# Patient Record
Sex: Female | Born: 1990 | Race: Black or African American | Hispanic: No | Marital: Single | Smoking: Never smoker
Health system: Southern US, Community
[De-identification: ages and names within clinical notes are randomized; demographics above are authoritative.]

## PROBLEM LIST (undated history)

## (undated) DIAGNOSIS — IMO0002 Reserved for concepts with insufficient information to code with codable children: Secondary | ICD-10-CM

## (undated) DIAGNOSIS — A630 Anogenital (venereal) warts: Secondary | ICD-10-CM

## (undated) DIAGNOSIS — R519 Headache, unspecified: Secondary | ICD-10-CM

## (undated) HISTORY — DX: Reserved for concepts with insufficient information to code with codable children: IMO0002

## (undated) HISTORY — DX: Anogenital (venereal) warts: A63.0

---
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
## 2008-05-11 IMAGING — US Endovaginal
2 series · 14 of 16 positions shown · non-contrast
Comparison: none

[Series 1: endovaginal · 0.13mm/px · 12 of 15 slices shown (1 of 2)]
[im 1/15]
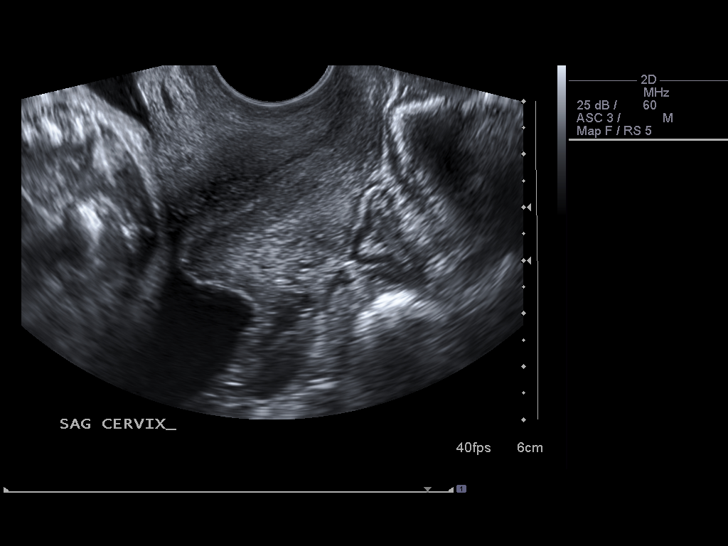
[im 2/15]
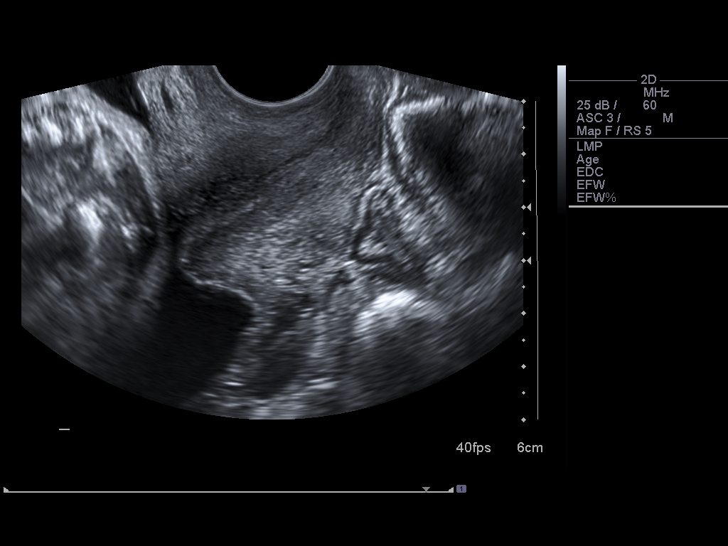
[im 3/15]
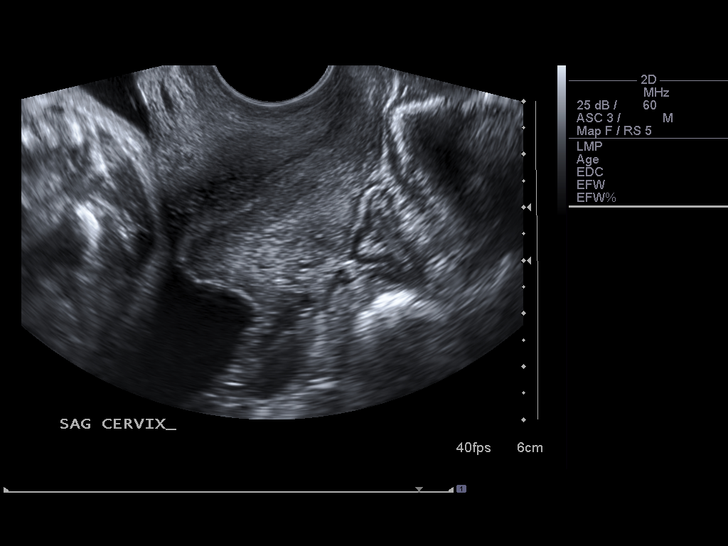
[im 5/15]
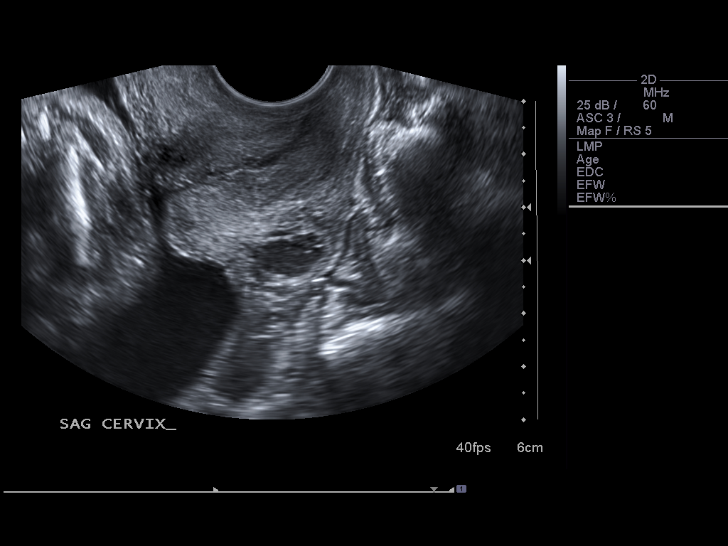
[im 6/15]
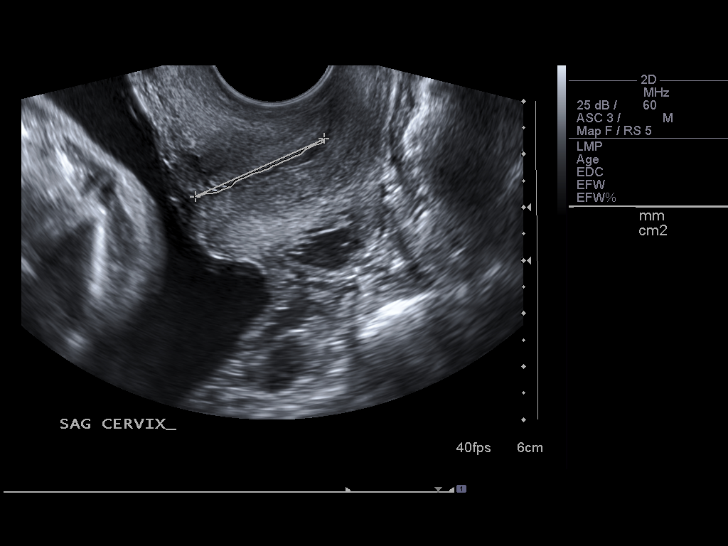
[im 7/15]
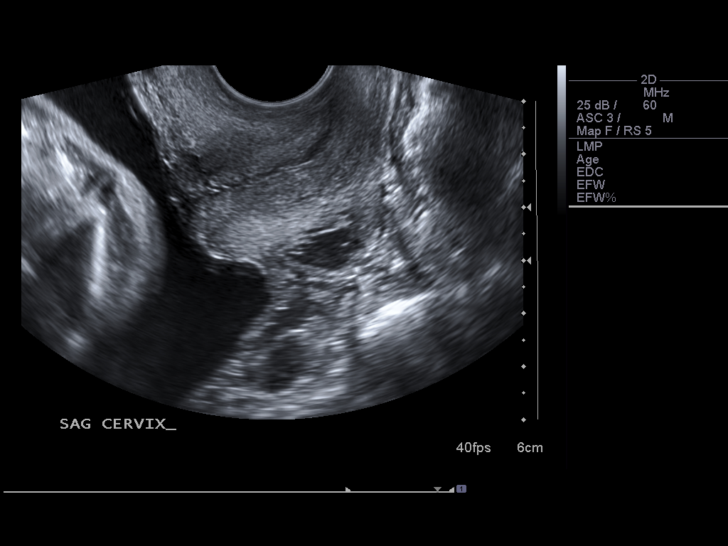
[im 8/15]
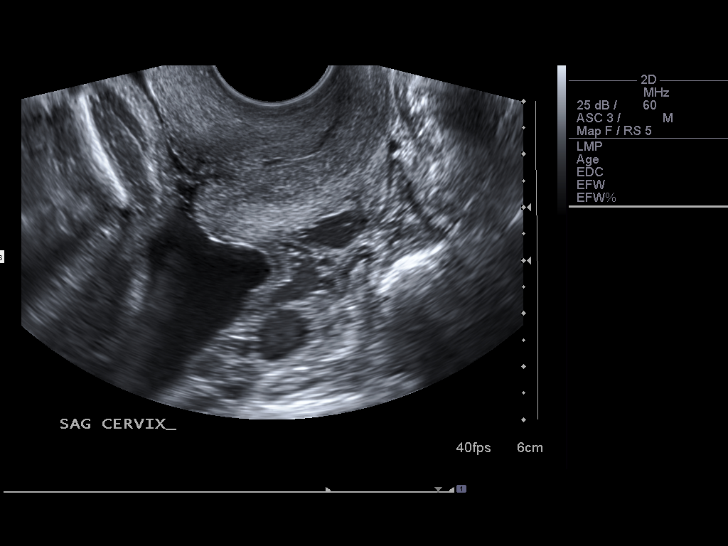
[im 9/15]
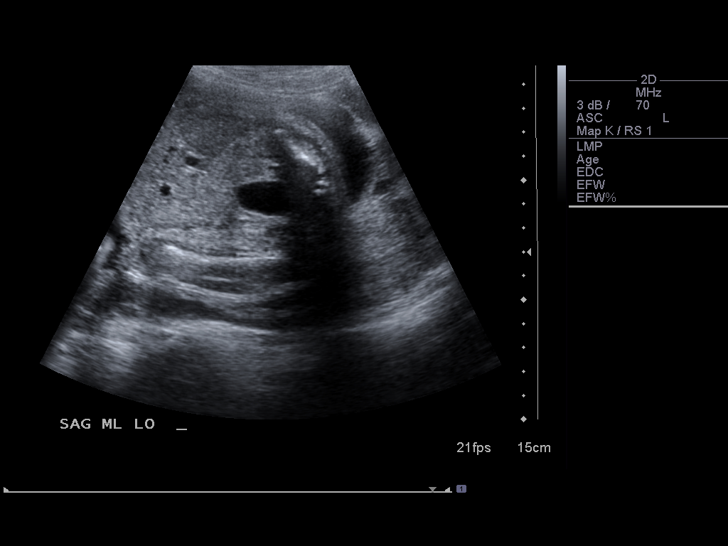
[im 10/15]
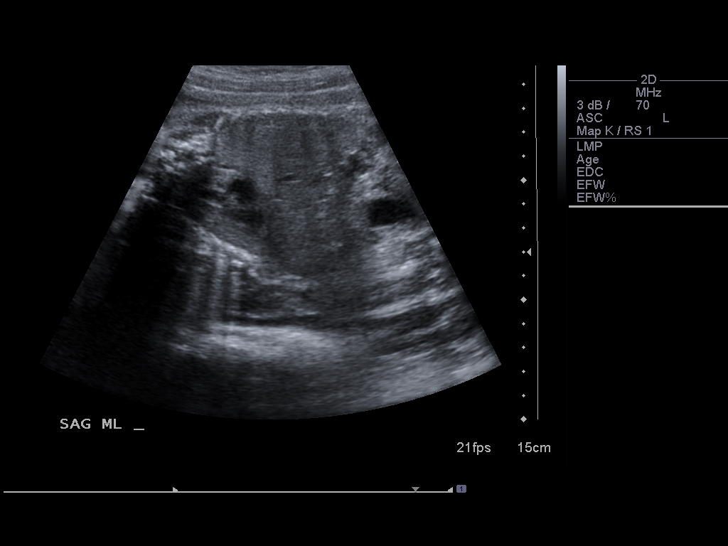
[im 11/15]
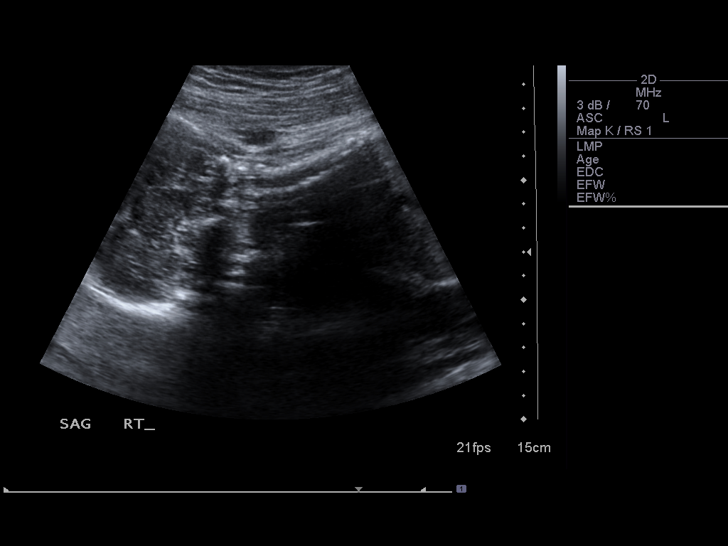
[im 13/15]
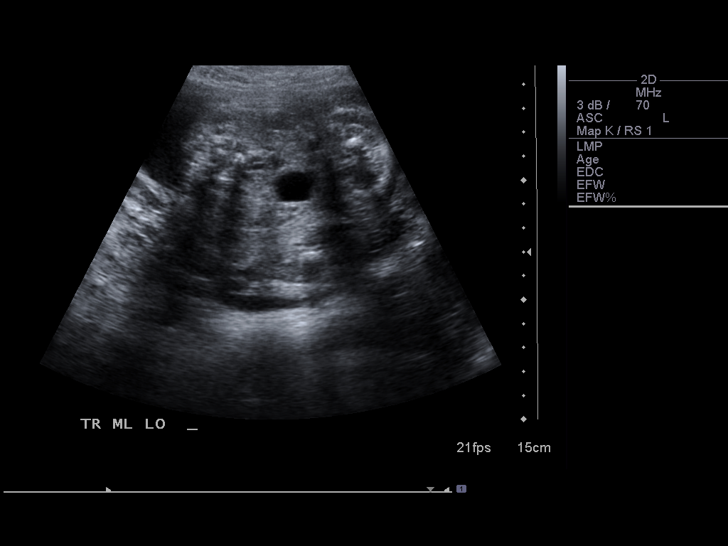
[im 15/15]
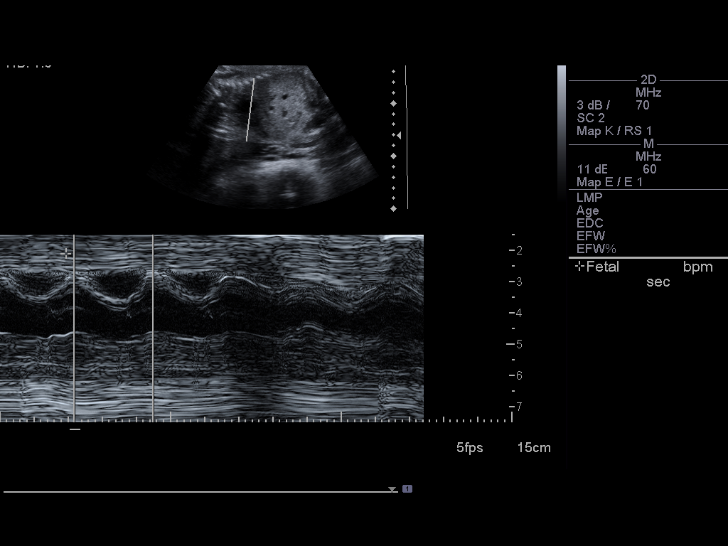

[Series 3: endovaginal · 0.13mm/px · 2 of 2 slices shown (2 of 2)]
[im 1/2]
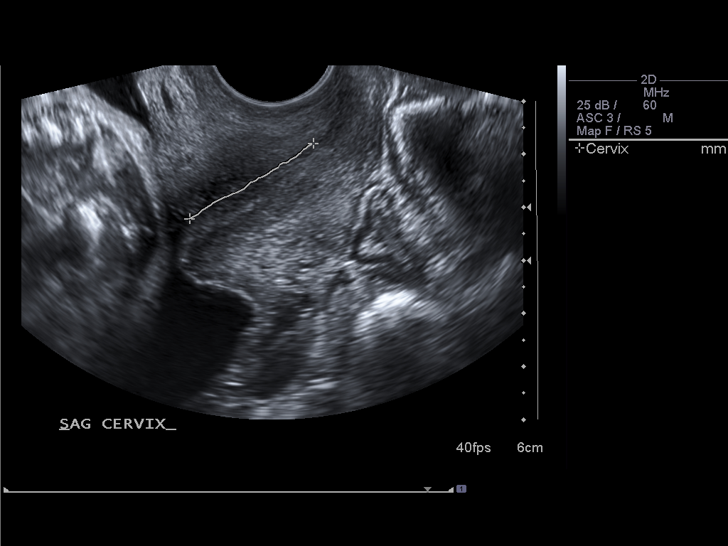
[im 2/2]
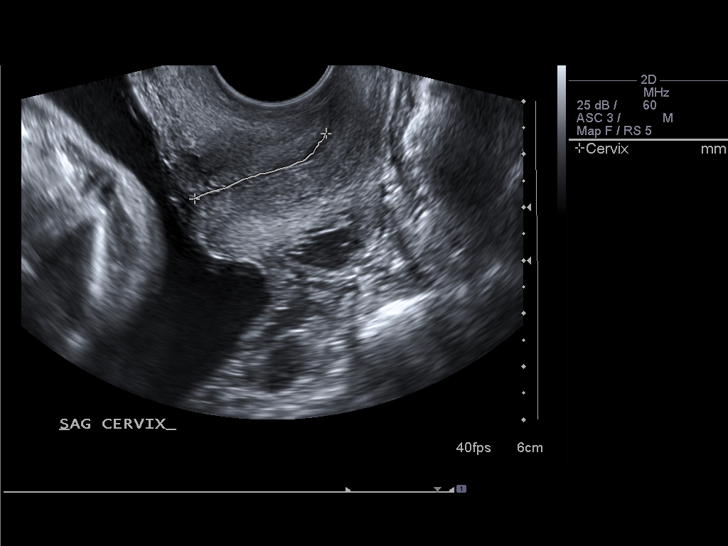

[14 of 16 positions shown; findings below may reference images not displayed]

Transvaginal was done just to evaluate for cervical length.  

The cervix measures 2.7 cm.  This is foreshortened.  

The results were given to Dr. Morjen by the ultrasound technologist.  

IMPRESSION-  

Foreshortened cervix.

## 2008-08-09 IMAGING — US OB
1 series · 16 of 16 positions shown · non-contrast
Comparison: none

[Series 1: ob · 16 of 17 slices shown]
[im 1/17]
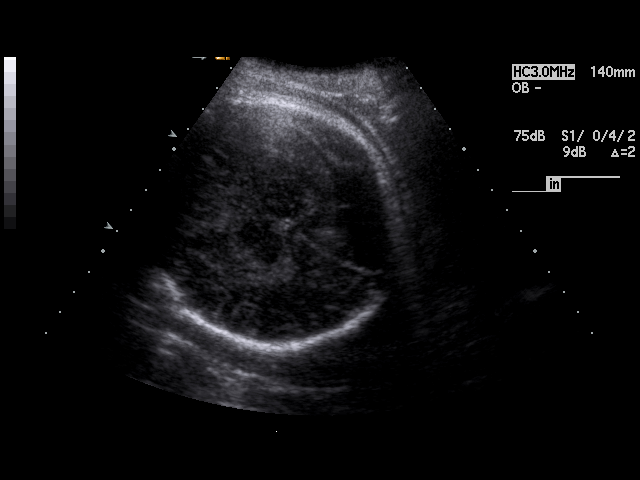
[im 2/17]
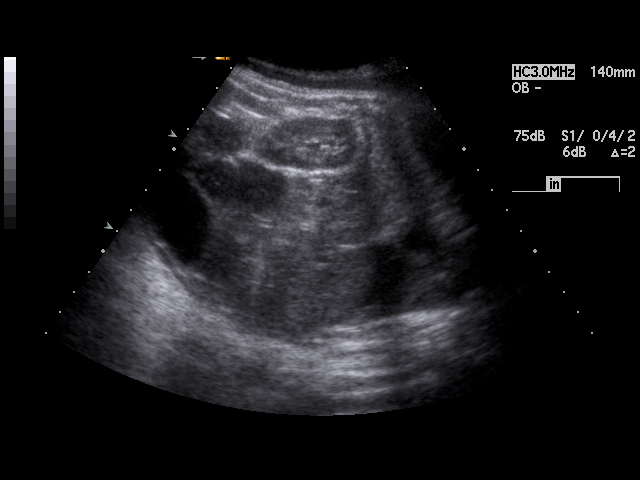
[im 3/17]
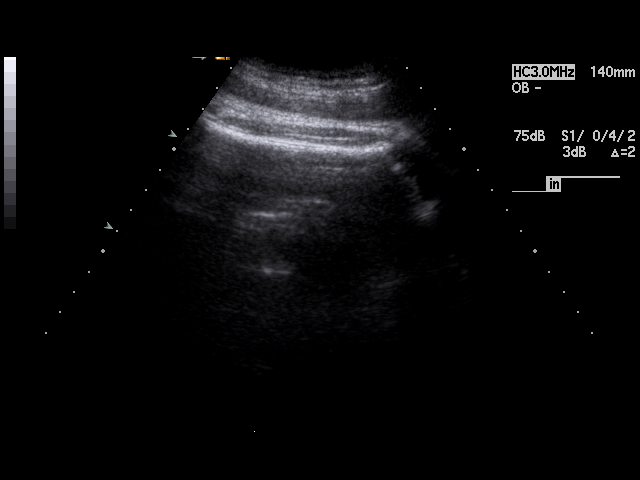
[im 4/17]
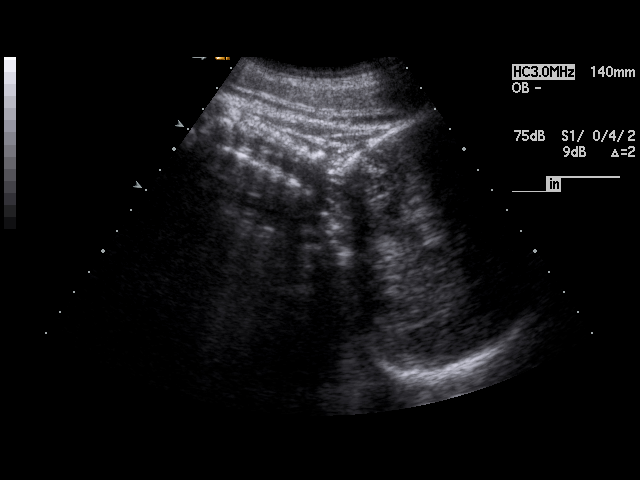
[im 5/17]
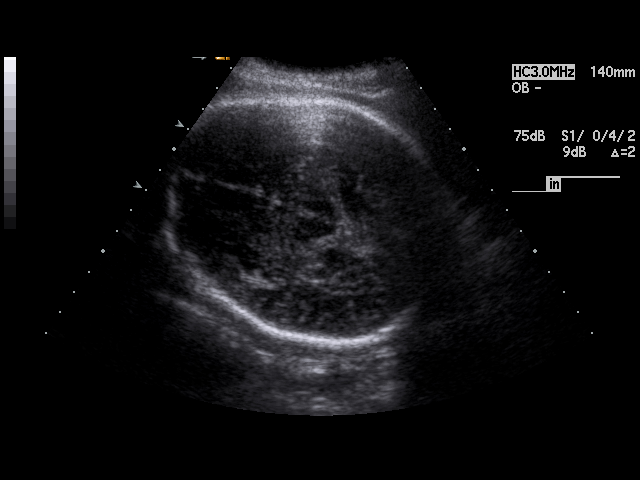
[im 6/17]
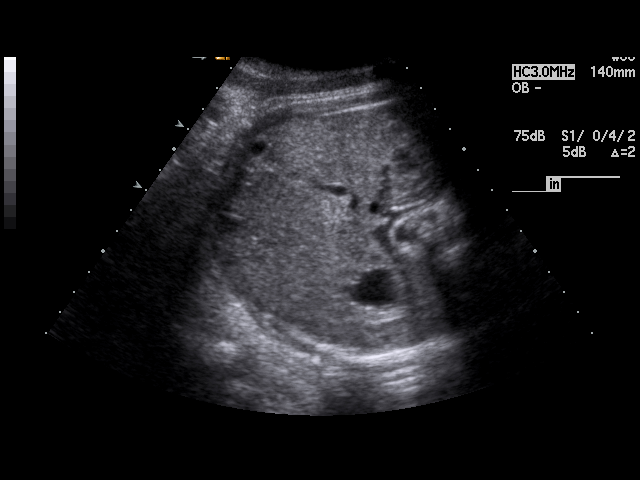
[im 7/17]
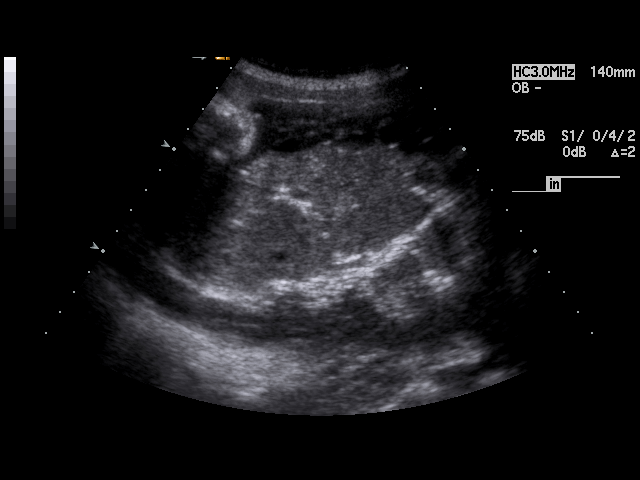
[im 8/17]
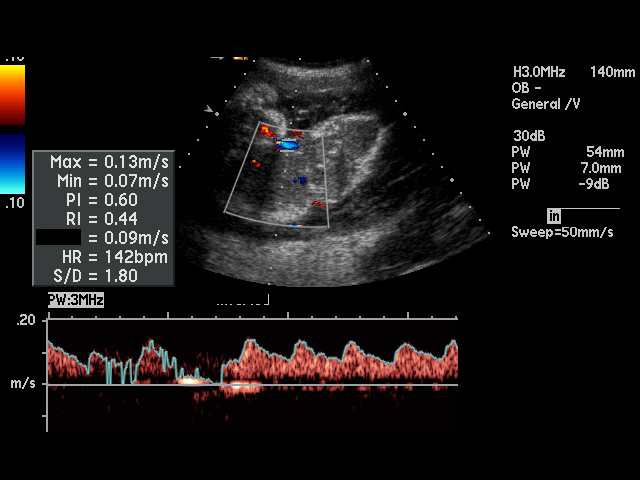
[im 9/17]
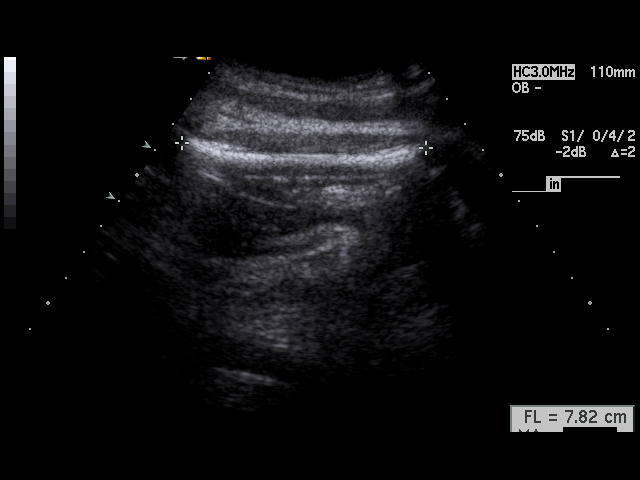
[im 10/17]
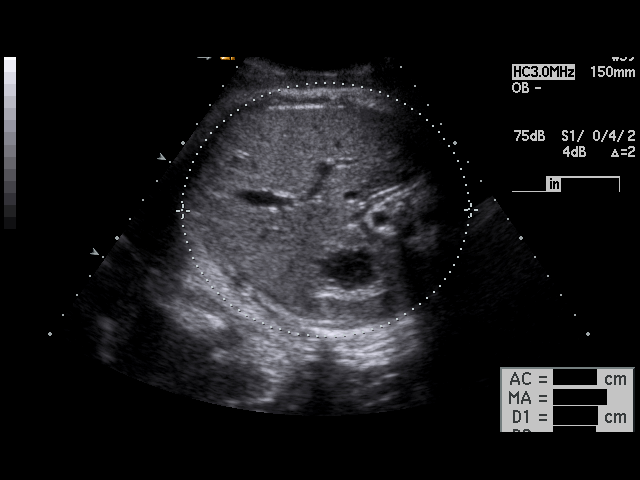
[im 11/17]
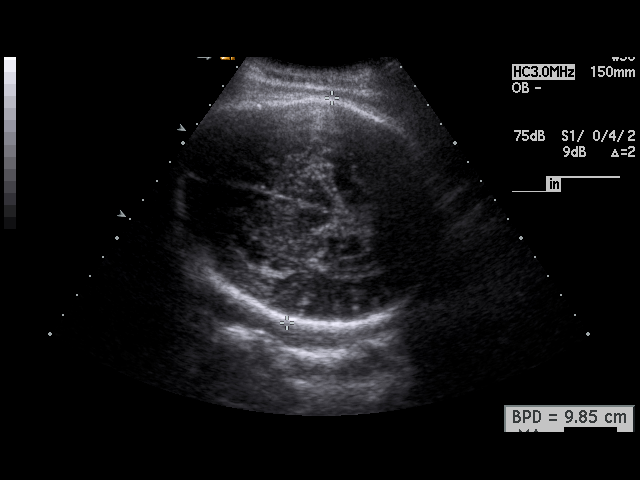
[im 12/17]
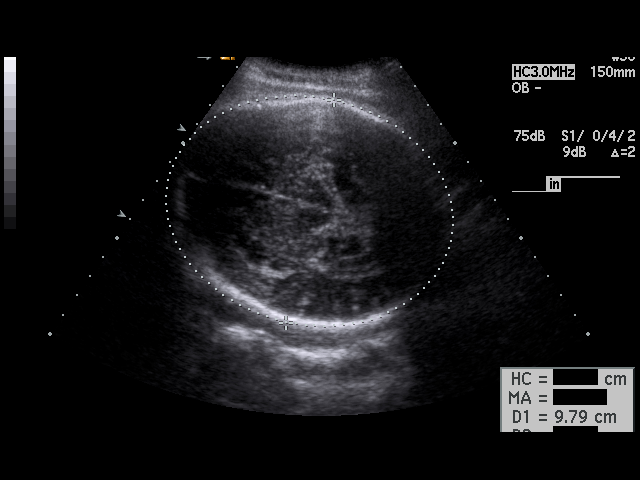
[im 13/17]
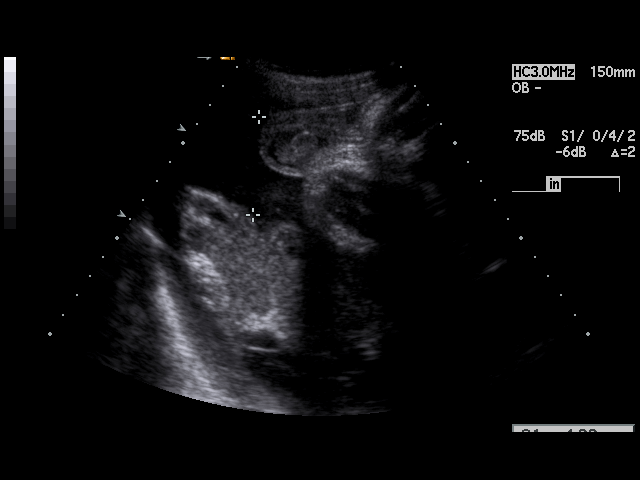
[im 14/17]
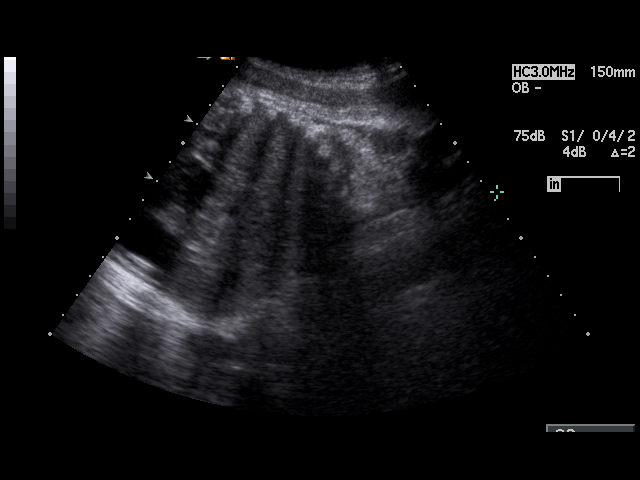
[im 15/17]
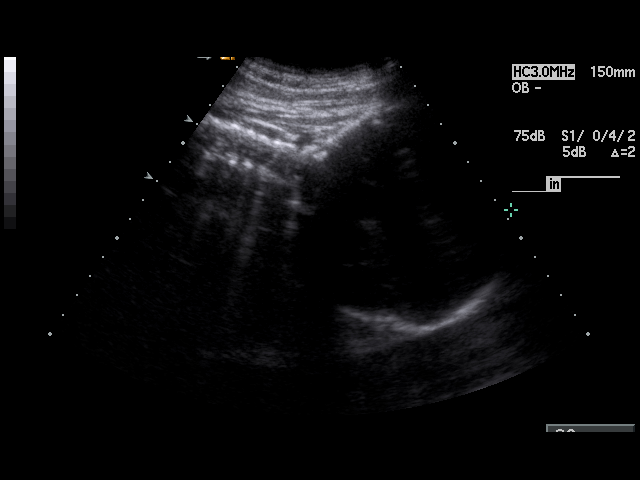
[im 17/17]
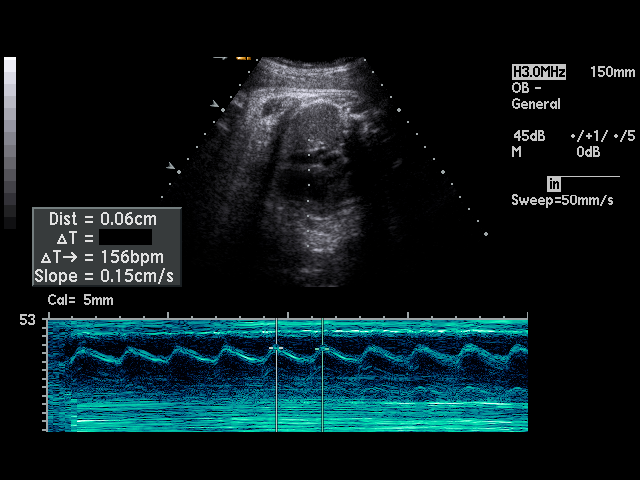

[16 of 16 positions shown; findings below may reference images not displayed]

REASON-  Post dates.

There is an intrauterine gestation in cephalic presentation.  There is

a posterior placenta.  The amniotic fluid index is 5.2 cm.  

BPD- 9.8, HC- 35.0, AC- 36.6 and FL- 7.8 cm correspond to a 40 week 3

day gestation.  The estimated date of delivery was 05/13/07.  There has

been appropriate interval growth compared to initial ultrasound dated

Biophysical profile score is [DATE].  Zero was given for amniotic fluid

volume by the ultrasound technologist. 

The estimated fetal weight is 4,106 grams.  Fetal heartbeat is 156 bpm.

The S/D ratio of the umbilical artery is 1.80.

IMPRESSION-

SINGLE LIVE INTRAUTERINE GESTATION MEAN ULTRASOUND OF 40 WEEKS 3 DAYS.

AFI 5.2 CM.

RESULTS WERE GIVEN [REDACTED] IN LABOR & DELIVERY BY THE ULTRASOUND

TECHNOLOGIST.

## 2010-03-09 DIAGNOSIS — IMO0002 Reserved for concepts with insufficient information to code with codable children: Secondary | ICD-10-CM

## 2010-03-09 DIAGNOSIS — R87619 Unspecified abnormal cytological findings in specimens from cervix uteri: Secondary | ICD-10-CM

## 2010-03-09 HISTORY — DX: Reserved for concepts with insufficient information to code with codable children: IMO0002

## 2010-03-09 HISTORY — DX: Unspecified abnormal cytological findings in specimens from cervix uteri: R87.619

## 2011-04-06 LAB — OB RESULTS CONSOLE HGB/HCT, BLOOD
HCT: 35 %
Hemoglobin: 11.9 g/dL

## 2011-04-06 LAB — CULTURE, OB URINE: Urine Culture, OB: NO GROWTH

## 2011-04-06 LAB — OB RESULTS CONSOLE RPR: RPR: NONREACTIVE

## 2011-05-04 LAB — GLUCOSE TOLERANCE, 1 HOUR: Glucose, 1 hour: 125

## 2011-07-20 ENCOUNTER — Ambulatory Visit (INDEPENDENT_AMBULATORY_CARE_PROVIDER_SITE_OTHER): Payer: Self-pay | Admitting: Family Medicine

## 2011-07-20 ENCOUNTER — Encounter: Payer: Self-pay | Admitting: Family Medicine

## 2011-07-20 VITALS — BP 123/83 | Temp 97.7°F | Ht 66.0 in | Wt 178.7 lb

## 2011-07-20 DIAGNOSIS — O34219 Maternal care for unspecified type scar from previous cesarean delivery: Secondary | ICD-10-CM

## 2011-07-20 DIAGNOSIS — O0990 Supervision of high risk pregnancy, unspecified, unspecified trimester: Secondary | ICD-10-CM | POA: Insufficient documentation

## 2011-07-20 LAB — POCT URINALYSIS DIP (DEVICE)
Bilirubin Urine: NEGATIVE
Hgb urine dipstick: NEGATIVE
Ketones, ur: NEGATIVE mg/dL
Specific Gravity, Urine: 1.025 (ref 1.005–1.030)
pH: 6 (ref 5.0–8.0)

## 2011-07-20 LAB — HIV ANTIBODY (ROUTINE TESTING W REFLEX): HIV: NONREACTIVE

## 2011-07-20 LAB — CBC
MCHC: 33.8 g/dL (ref 30.0–36.0)
Platelets: 161 10*3/uL (ref 150–400)
RDW: 13.9 % (ref 11.5–15.5)
WBC: 8.1 10*3/uL (ref 4.0–10.5)

## 2011-07-20 LAB — OB RESULTS CONSOLE GBS: GBS: NEGATIVE

## 2011-07-20 LAB — RPR

## 2011-07-20 NOTE — Patient Instructions (Addendum)
Vaginal Birth After Cesarean Delivery Vaginal birth after Cesarean delivery (VBAC) is giving birth vaginally after previously delivering a baby by a cesarean. In the past, if a woman had a Cesarean delivery, all births afterwards would be done by Cesarean delivery. This is no longer true. It can be safe for the mother to try a vaginal delivery after having a Cesarean. The final decision to have a VBAC or repeat Cesarean delivery should be between the patient and her caregiver. The risks and benefits can be discussed relative to the reason for, and the type of the previous Cesarean delivery. WOMEN WHO PLAN TO HAVE A VBAC SHOULD CHECK WITH THEIR DOCTOR TO BE SURE THAT:  The previous Cesarean was done with a low transverse uterine incision (not a vertical classical incision).   The birth canal is big enough for the baby.   There were no other operations on the uterus.   They will have an electronic fetal monitor (EFM) on at all times during labor.   An operating room would be available and ready in case an emergency Cesarean is needed.   A doctor and surgical nursing staff would be available at all times during labor to be ready to do an emergency Cesarean if necessary.   An anesthesiologist would be present in case an emergency Cesarean is needed.   The nursery is prepared and has adequate personnel and necessary equipment available to care for the baby in case of an emergency Cesarean.  BENEFITS OF VBAC:  Shorter stay in the hospital.   Lower delivery, nursery and hospital costs.   Less blood loss and need for blood transfusions.   Less fever and discomfort from major surgery.   Lower risk of blood clots.   Lower risk of infection.   Shorter recovery after going home.   Lower risk of other surgical complications, such as opening of the incision or hernia in the incision.   Decreased risk of injury to other organs.   Decreased risk for having to remove the uterus (hysterectomy).     Decreased risk for the placenta to completely or partially cover the opening of the uterus (placenta previa) with a future pregnancy.   Ability to have a larger family if desired.  RISKS OF A VBAC:  Rupture of the uterus.   Having to remove the uterus (hysterectomy) if it ruptures.   All the complications of major surgery and/or injury to other organs.   Excessive bleeding, blood clots and infection.   Lower Apgar scores (method to evaluate the newborn based on appearance, pulse, grimace, activity, and respiration) and more risks to the baby.   There is a higher risk of uterine rupture if you induce or augment labor.   There is a higher risk of uterine rupture if you use medications to ripen the cervix.  VBAC SHOULD NOT BE DONE IF:  The previous Cesarean was done with a vertical (classical) or T-shaped incision, or you do not know what kind of an incision was made.   You had a ruptured uterus.   You had surgery on your uterus.   You have medical or obstetrical problems.   There are problems with the baby.   There were two previous Cesarean deliveries and no vaginal deliveries.  OTHER FACTS TO KNOW ABOUT VBAC:  It is safe to have an epidural anesthetic with VBAC.   It is safe to turn the baby from a breech position (attempt an external cephalic version).   It is  safe to try a VBAC with twins.   Pregnancies later than 40 weeks have not been successful with VBAC.   There is an increased failure rate of a VBAC in obese pregnant women.   There is an increased failure rate with VABC if the baby weighs 8.8 pounds (4000 grams) or more.   There is an increased failure rate if the time between the Cesarean and VBAC is less than 19 months.   There is an increased failure rate if pre-eclampsia is present (high blood pressure, protein in the urine and swelling of face and extremities).   VBAC is very successful if there was a previous vaginal birth.   VBAC is very successful  when the labor starts spontaneously before the due date.   Delivery of VBAC is similar to having a normal spontaneous vaginal delivery.  It is important to discuss VBAC with your caregiver early in the pregnancy so you can understand the risks, benefits and options. It will give you time to decide what is best in your particular case relevant to the reason for your previous Cesarean delivery. It should be understood that medical changes in the mother or pregnancy may occur during the pregnancy, which make it necessary to change you or your caregiver's initial decision. The counseling, concerns and decisions should be documented in the medical record and signed by all parties. Document Released: 08/16/2006 Document Revised: 02/12/2011 Document Reviewed: 04/06/2008 [facility] Patient Information 2012 [place], [place]. Pregnancy - Third Trimester The third trimester of pregnancy (the last 3 months) is a period of the most rapid growth for you and your baby. The baby approaches a length of 20 inches and a weight of 6 to 10 pounds. The baby is adding on fat and getting ready for life outside your body. While inside, babies have periods of sleeping and waking, suck their thumbs, and hiccups. You can often feel small contractions of the uterus. This is false labor. It is also called Braxton-Hicks contractions. This is like a practice for labor. The usual problems in this stage of pregnancy include more difficulty breathing, swelling of the hands and feet from water retention, and having to urinate more often because of the uterus and baby pressing on your bladder.  PRENATAL EXAMS  Blood work may continue to be done during prenatal exams. These tests are done to check on your health and the probable health of your baby. Blood work is used to follow your blood levels (hemoglobin). Anemia (low hemoglobin) is common during pregnancy. Iron and vitamins are given to help prevent this. You may also continue to be checked  for diabetes. Some of the past blood tests may be done again.   The size of the uterus is measured during each visit. This makes sure your baby is growing properly according to your pregnancy dates.   Your blood pressure is checked every prenatal visit. This is to make sure you are not getting toxemia.   Your urine is checked every prenatal visit for infection, diabetes and protein.   Your weight is checked at each visit. This is done to make sure gains are happening at the suggested rate and that you and your baby are growing normally.   Sometimes, an ultrasound is performed to confirm the position and the proper growth and development of the baby. This is a test done that bounces harmless sound waves off the baby so your caregiver can more accurately determine due dates.   Discuss the type of pain medication  and anesthesia you will have during your labor and delivery.   Discuss the possibility and anesthesia if a Cesarean Section might be necessary.   Inform your caregiver if there is any mental or physical violence at home.  Sometimes, a specialized non-stress test, contraction stress test and biophysical profile are done to make sure the baby is not having a problem. Checking the amniotic fluid surrounding the baby is called an amniocentesis. The amniotic fluid is removed by sticking a needle into the belly (abdomen). This is sometimes done near the end of pregnancy if an early delivery is required. In this case, it is done to help make sure the baby's lungs are mature enough for the baby to live outside of the womb. If the lungs are not mature and it is unsafe to deliver the baby, an injection of cortisone medication is given to the mother 1 to 2 days before the delivery. This helps the baby's lungs mature and makes it safer to deliver the baby. CHANGES OCCURING IN THE THIRD TRIMESTER OF PREGNANCY Your body goes through many changes during pregnancy. They vary from person to person. Talk to  your caregiver about changes you notice and are concerned about.  During the last trimester, you have probably had an increase in your appetite. It is normal to have cravings for certain foods. This varies from person to person and pregnancy to pregnancy.   You may begin to get stretch marks on your hips, abdomen, and breasts. These are normal changes in the body during pregnancy. There are no exercises or medications to take which prevent this change.   Constipation may be treated with a stool softener or adding bulk to your diet. Drinking lots of fluids, fiber in vegetables, fruits, and whole grains are helpful.   Exercising is also helpful. If you have been very active up until your pregnancy, most of these activities can be continued during your pregnancy. If you have been less active, it is helpful to start an exercise program such as walking. Consult your caregiver before starting exercise programs.   Avoid all smoking, alcohol, un-prescribed drugs, herbs and "street drugs" during your pregnancy. These chemicals affect the formation and growth of the baby. Avoid chemicals throughout the pregnancy to ensure the delivery of a healthy infant.   Backache, varicose veins and hemorrhoids may develop or get worse.   You will tire more easily in the third trimester, which is normal.   The baby's movements may be stronger and more often.   You may become short of breath easily.   Your belly button may stick out.   A yellow discharge may leak from your breasts called colostrum.   You may have a bloody mucus discharge. This usually occurs a few days to a week before labor begins.  HOME CARE INSTRUCTIONS   Keep your caregiver's appointments. Follow your caregiver's instructions regarding medication use, exercise, and diet.   During pregnancy, you are providing food for you and your baby. Continue to eat regular, well-balanced meals. Choose foods such as meat, fish, milk and other low fat dairy  products, vegetables, fruits, and whole-grain breads and cereals. Your caregiver will tell you of the ideal weight gain.   A physical sexual relationship may be continued throughout pregnancy if there are no other problems such as early (premature) leaking of amniotic fluid from the membranes, vaginal bleeding, or belly (abdominal) pain.   Exercise regularly if there are no restrictions. Check with your caregiver if you are  unsure of the safety of your exercises. Greater weight gain will occur in the last 2 trimesters of pregnancy. Exercising helps:   Control your weight.   Get you in shape for labor and delivery.   You lose weight after you deliver.   Rest a lot with legs elevated, or as needed for leg cramps or low back pain.   Wear a good support or jogging bra for breast tenderness during pregnancy. This may help if worn during sleep. Pads or tissues may be used in the bra if you are leaking colostrum.   Do not use hot tubs, steam rooms, or saunas.   Wear your seat belt when driving. This protects you and your baby if you are in an accident.   Avoid raw meat, cat litter boxes and soil used by cats. These carry germs that can cause birth defects in the baby.   It is easier to loose urine during pregnancy. Tightening up and strengthening the pelvic muscles will help with this problem. You can practice stopping your urination while you are going to the bathroom. These are the same muscles you need to strengthen. It is also the muscles you would use if you were trying to stop from passing gas. You can practice tightening these muscles up 10 times a set and repeating this about 3 times per day. Once you know what muscles to tighten up, do not perform these exercises during urination. It is more likely to cause an infection by backing up the urine.   Ask for help if you have financial, counseling or nutritional needs during pregnancy. Your caregiver will be able to offer counseling for these  needs as well as refer you for other special needs.   Make a list of emergency phone numbers and have them available.   Plan on getting help from family or friends when you go home from the hospital.   Make a trial run to the hospital.   Take prenatal classes with the father to understand, practice and ask questions about the labor and delivery.   Prepare the baby's room/nursery.   Do not travel out of the city unless it is absolutely necessary and with the advice of your caregiver.   Wear only low or no heal shoes to have better balance and prevent falling.  MEDICATIONS AND DRUG USE IN PREGNANCY  Take prenatal vitamins as directed. The vitamin should contain 1 milligram of folic acid. Keep all vitamins out of reach of children. Only a couple vitamins or tablets containing iron may be fatal to a baby or young child when ingested.   Avoid use of all medications, including herbs, over-the-counter medications, not prescribed or suggested by your caregiver. Only take over-the-counter or prescription medicines for pain, discomfort, or fever as directed by your caregiver. Do not use aspirin, ibuprofen (Motrin, Advil, Nuprin) or naproxen (Aleve) unless OK'd by your caregiver.   Let your caregiver also know about herbs you may be using.   Alcohol is related to a number of birth defects. This includes fetal alcohol syndrome. All alcohol, in any form, should be avoided completely. Smoking will cause low birth rate and premature babies.   Street/illegal drugs are very harmful to the baby. They are absolutely forbidden. A baby born to an addicted mother will be addicted at birth. The baby will go through the same withdrawal an adult does.  SEEK MEDICAL CARE IF: You have any concerns or worries during your pregnancy. It is better to call with  your questions if you feel they cannot wait, rather than worry about them. DECISIONS ABOUT CIRCUMCISION You may or may not know the sex of your baby. If you  know your baby is a boy, it may be time to think about circumcision. Circumcision is the removal of the foreskin of the penis. This is the skin that covers the sensitive end of the penis. There is no proven medical need for this. Often this decision is made on what is popular at the time or based upon religious beliefs and social issues. You can discuss these issues with your caregiver or pediatrician. SEEK IMMEDIATE MEDICAL CARE IF:   An unexplained oral temperature above 102 F (38.9 C) develops, or as your caregiver suggests.   You have leaking of fluid from the vagina (birth canal). If leaking membranes are suspected, take your temperature and tell your caregiver of this when you call.   There is vaginal spotting, bleeding or passing clots. Tell your caregiver of the amount and how many pads are used.   You develop a bad smelling vaginal discharge with a change in the color from clear to white.   You develop vomiting that lasts more than 24 hours.   You develop chills or fever.   You develop shortness of breath.   You develop burning on urination.   You loose more than 2 pounds of weight or gain more than 2 pounds of weight or as suggested by your caregiver.   You notice sudden swelling of your face, hands, and feet or legs.   You develop belly (abdominal) pain. Round ligament discomfort is a common non-cancerous (benign) cause of abdominal pain in pregnancy. Your caregiver still must evaluate you.   You develop a severe headache that does not go away.   You develop visual problems, blurred or double vision.   If you have not felt your baby move for more than 1 hour. If you think the baby is not moving as much as usual, eat something with sugar in it and lie down on your left side for an hour. The baby should move at least 4 to 5 times per hour. Call right away if your baby moves less than that.   You fall, are in a car accident or any kind of trauma.   There is mental or  physical violence at home.  Document Released: 02/17/2001 Document Revised: 02/12/2011 Document Reviewed: 08/22/2008 [facility] Patient Information 2012 [place], [place]. Contraception Choices Contraception (birth control) is the use of any methods or devices to prevent pregnancy. Below are some methods to help avoid pregnancy. HORMONAL METHODS   Contraceptive implant. This is a thin, plastic tube containing progesterone hormone. It does not contain estrogen hormone. Your caregiver inserts the tube in the inner part of the upper arm. The tube can remain in place for up to 3 years. After 3 years, the implant must be removed. The implant prevents the ovaries from releasing an egg (ovulation), thickens the cervical mucus which prevents sperm from entering the uterus, and thins the lining of the inside of the uterus.   Progesterone-only injections. These injections are given every 3 months by your caregiver to prevent pregnancy. This synthetic progesterone hormone stops the ovaries from releasing eggs. It also thickens cervical mucus and changes the uterine lining. This makes it harder for sperm to survive in the uterus.   Birth control pills. These pills contain estrogen and progesterone hormone. They work by stopping the egg from forming in the ovary (  ovulation). Birth control pills are prescribed by a caregiver.Birth control pills can also be used to treat heavy periods.   Minipill. This type of birth control pill contains only the progesterone hormone. They are taken every day of each month and must be prescribed by your caregiver.   Birth control patch. The patch contains hormones similar to those in birth control pills. It must be changed once a week and is prescribed by a caregiver.   Vaginal ring. The ring contains hormones similar to those in birth control pills. It is left in the vagina for 3 weeks, removed for 1 week, and then a new one is put back in place. The patient must be comfortable  inserting and removing the ring from the vagina.A caregiver's prescription is necessary.   Emergency contraception. Emergency contraceptives prevent pregnancy after unprotected sexual intercourse. This pill can be taken right after sex or up to 5 days after unprotected sex. It is most effective the sooner you take the pills after having sexual intercourse. Emergency contraceptive pills are available without a prescription. Check with your pharmacist. Do not use emergency contraception as your only form of birth control.  BARRIER METHODS   Female condom. This is a thin sheath (latex or rubber) that is worn over the penis during sexual intercourse. It can be used with spermicide to increase effectiveness.   Female condom. This is a soft, loose-fitting sheath that is put into the vagina before sexual intercourse.   Diaphragm. This is a soft, latex, dome-shaped barrier that must be fitted by a caregiver. It is inserted into the vagina, along with a spermicidal jelly. It is inserted before intercourse. The diaphragm should be left in the vagina for 6 to 8 hours after intercourse.   Cervical cap. This is a round, soft, latex or plastic cup that fits over the cervix and must be fitted by a caregiver. The cap can be left in place for up to 48 hours after intercourse.   Sponge. This is a soft, circular piece of polyurethane foam. The sponge has spermicide in it. It is inserted into the vagina after wetting it and before sexual intercourse.   Spermicides. These are chemicals that kill or block sperm from entering the cervix and uterus. They come in the form of creams, jellies, suppositories, foam, or tablets. They do not require a prescription. They are inserted into the vagina with an applicator before having sexual intercourse. The process must be repeated every time you have sexual intercourse.  INTRAUTERINE CONTRACEPTION  Intrauterine device (IUD). This is a T-shaped device that is put in a woman's uterus  during a menstrual period to prevent pregnancy. There are 2 types:   Copper IUD. This type of IUD is wrapped in copper wire and is placed inside the uterus. Copper makes the uterus and fallopian tubes produce a fluid that kills sperm. It can stay in place for 10 years.   Hormone IUD. This type of IUD contains the hormone progestin (synthetic progesterone). The hormone thickens the cervical mucus and prevents sperm from entering the uterus, and it also thins the uterine lining to prevent implantation of a fertilized egg. The hormone can weaken or kill the sperm that get into the uterus. It can stay in place for 5 years.  PERMANENT METHODS OF CONTRACEPTION  Female tubal ligation. This is when the woman's fallopian tubes are surgically sealed, tied, or blocked to prevent the egg from traveling to the uterus.   Female sterilization. This is when the  female has the tubes that carry sperm tied off (vasectomy).This blocks sperm from entering the vagina during sexual intercourse. After the procedure, the man can still ejaculate fluid (semen).  NATURAL PLANNING METHODS  Natural family planning. This is not having sexual intercourse or using a barrier method (condom, diaphragm, cervical cap) on days the woman could become pregnant.   Calendar method. This is keeping track of the length of each menstrual cycle and identifying when you are fertile.   Ovulation method. This is avoiding sexual intercourse during ovulation.   Symptothermal method. This is avoiding sexual intercourse during ovulation, using a thermometer and ovulation symptoms.   Post-ovulation method. This is timing sexual intercourse after you have ovulated.  Regardless of which type or method of contraception you choose, it is important that you use condoms to protect against the transmission of sexually transmitted diseases (STDs). Talk with your caregiver about which form of contraception is most appropriate for you. Document Released:  02/23/2005 Document Revised: 02/12/2011 Document Reviewed: 07/02/2010 [facility] Patient Information 2012 [place], [place]. Breastfeeding BENEFITS OF BREASTFEEDING For the baby  The first milk (colostrum) helps the baby's digestive system function better.   There are antibodies from the mother in the milk that help the baby fight off infections.   The baby has a lower incidence of asthma, allergies, and SIDS (sudden infant death syndrome).   The nutrients in breast milk are better than formulas for the baby and helps the baby's brain grow better.   Babies who breastfeed have less gas, colic, and constipation.  For the mother  Breastfeeding helps develop a very special bond between mother and baby.   It is more convenient, always available at the correct temperature and cheaper than formula feeding.   It burns calories in the mother and helps with losing weight that was gained during pregnancy.   It makes the uterus contract back down to normal size faster and slows bleeding following delivery.   Breastfeeding mothers have a lower risk of developing breast cancer.  NURSE FREQUENTLY  A healthy, full-term baby may breastfeed as often as every hour or space his or her feedings to every 3 hours.   How often to nurse will vary from baby to baby. Watch your baby for signs of hunger, not the clock.   Nurse as often as the baby requests, or when you feel the need to reduce the fullness of your breasts.   Awaken the baby if it has been 3 to 4 hours since the last feeding.   Frequent feeding will help the mother make more milk and will prevent problems like sore nipples and engorgement of the breasts.  BABY'S POSITION AT THE BREAST  Whether lying down or sitting, be sure that the baby's tummy is facing your tummy.   Support the breast with 4 fingers underneath the breast and the thumb above. Make sure your fingers are well away from the nipple and baby's mouth.   Stroke the baby's lips  and cheek closest to the breast gently with your finger or nipple.   When the baby's mouth is open wide enough, place all of your nipple and as much of the dark area around the nipple as possible into your baby's mouth.   Pull the baby in close so the tip of the nose and the baby's cheeks touch the breast during the feeding.  FEEDINGS  The length of each feeding varies from baby to baby and from feeding to feeding.   The baby  must suck about 2 to 3 minutes for your milk to get to him or her. This is called a "let down." For this reason, allow the baby to feed on each breast as long as he or she wants. Your baby will end the feeding when he or she has received the right balance of nutrients.   To break the suction, put your finger into the corner of the baby's mouth and slide it between his or her gums before removing your breast from his or her mouth. This will help prevent sore nipples.  REDUCING BREAST ENGORGEMENT  In the first week after your baby is born, you may experience signs of breast engorgement. When breasts are engorged, they feel heavy, warm, full, and may be tender to the touch. You can reduce engorgement if you:   Nurse frequently, every 2 to 3 hours. Mothers who breastfeed early and often have fewer problems with engorgement.   Place light ice packs on your breasts between feedings. This reduces swelling. Wrap the ice packs in a lightweight towel to protect your skin.   Apply moist hot packs to your breast for 5 to 10 minutes before each feeding. This increases circulation and helps the milk flow.   Gently massage your breast before and during the feeding.   Make sure that the baby empties at least one breast at every feeding before switching sides.   Use a breast pump to empty the breasts if your baby is sleepy or not nursing well. You may also want to pump if you are returning to work or or you feel you are getting engorged.   Avoid bottle feeds, pacifiers or  supplemental feedings of water or juice in place of breastfeeding.   Be sure the baby is latched on and positioned properly while breastfeeding.   Prevent fatigue, stress, and anemia.   Wear a supportive bra, avoiding underwire styles.   Eat a balanced diet with enough fluids.  If you follow these suggestions, your engorgement should improve in 24 to 48 hours. If you are still experiencing difficulty, call your lactation consultant or caregiver. IS MY BABY GETTING ENOUGH MILK? Sometimes, mothers worry about whether their babies are getting enough milk. You can be assured that your baby is getting enough milk if:  The baby is actively sucking and you hear swallowing.   The baby nurses at least 8 to 12 times in a 24 hour time period. Nurse your baby until he or she unlatches or falls asleep at the first breast (at least 10 to 20 minutes), then offer the second side.   The baby is wetting 5 to 6 disposable diapers (6 to 8 cloth diapers) in a 24 hour period by 27 to 48 days of age.   The baby is having at least 2 to 3 stools every 24 hours for the first few months. Breast milk is all the food your baby needs. It is not necessary for your baby to have water or formula. In fact, to help your breasts make more milk, it is best not to give your baby supplemental feedings during the early weeks.   The stool should be soft and yellow.   The baby should gain 4 to 7 ounces per week after he is 108 days old.  TAKE CARE OF YOURSELF Take care of your breasts by:  Bathing or showering daily.   Avoiding the use of soaps on your nipples.   Start feedings on your left breast at one feeding  and on your right breast at the next feeding.   You will notice an increase in your milk supply 2 to 5 days after delivery. You may feel some discomfort from engorgement, which makes your breasts very firm and often tender. Engorgement "peaks" out within 24 to 48 hours. In the meantime, apply warm moist towels to your  breasts for 5 to 10 minutes before feeding. Gentle massage and expression of some milk before feeding will soften your breasts, making it easier for your baby to latch on. Wear a well fitting nursing bra and air dry your nipples for 10 to 15 minutes after each feeding.   Only use cotton bra pads.   Only use pure lanolin on your nipples after nursing. You do not need to wash it off before nursing.  Take care of yourself by:   Eating well-balanced meals and nutritious snacks.   Drinking milk, fruit juice, and water to satisfy your thirst (about 8 glasses a day).   Getting plenty of rest.   Increasing calcium in your diet (1200 mg a day).   Avoiding foods that you notice affect the baby in a bad way.  SEEK MEDICAL CARE IF:   You have any questions or difficulty with breastfeeding.   You need help.   You have a hard, red, sore area on your breast, accompanied by a fever of 100.5 F (38.1 C) or more.   Your baby is too sleepy to eat well or is having trouble sleeping.   Your baby is wetting less than 6 diapers per day, by 37 days of age.   Your baby's skin or white part of his or her eyes is more yellow than it was in the hospital.   You feel depressed.  Document Released: 02/23/2005 Document Revised: 02/12/2011 Document Reviewed: 10/08/2008 [facility] Patient Information 2012 [place], [place].

## 2011-07-20 NOTE — Progress Notes (Signed)
New pt. Today, previously seen in [place].  Had one Csection for failed induction, considering TOLAC.  Info and risks reveiwed.  Needs cultures and other labs today.

## 2011-07-20 NOTE — Progress Notes (Signed)
Pulse 136 Needs GBS, gc & ch Patient reports migraine headaches at times with seeing spots; also sometimes feels heart beating fast

## 2011-07-20 NOTE — Progress Notes (Signed)
ROI signed to obtain MRI results from [place]. Medical records from [facility] to call with the name of the facility patient had the MRI so the release can be faxed.

## 2011-07-21 LAB — GC/CHLAMYDIA PROBE AMP, GENITAL
Chlamydia, DNA Probe: NEGATIVE
GC Probe Amp, Genital: NEGATIVE

## 2011-07-23 ENCOUNTER — Telehealth: Payer: Self-pay | Admitting: *Deleted

## 2011-07-23 DIAGNOSIS — O34219 Maternal care for unspecified type scar from previous cesarean delivery: Secondary | ICD-10-CM

## 2011-07-23 LAB — CULTURE, BETA STREP (GROUP B ONLY)

## 2011-07-23 NOTE — Telephone Encounter (Signed)
Called patient and got voicemail. Left her a message to call us back.

## 2011-07-23 NOTE — Telephone Encounter (Signed)
Message copied by Mannie Stabile on Thu Jul 23, 2011  1:16 PM ------      Message from: Reva Bores      Created: Tue Jul 21, 2011  4:51 PM       TSH is normal and does not explain her increased heart rate

## 2011-07-27 ENCOUNTER — Ambulatory Visit (INDEPENDENT_AMBULATORY_CARE_PROVIDER_SITE_OTHER): Payer: Self-pay | Admitting: Family Medicine

## 2011-07-27 VITALS — BP 136/78 | Temp 97.2°F | Wt 185.0 lb

## 2011-07-27 DIAGNOSIS — O34219 Maternal care for unspecified type scar from previous cesarean delivery: Secondary | ICD-10-CM

## 2011-07-27 LAB — POCT URINALYSIS DIP (DEVICE)
Bilirubin Urine: NEGATIVE
Glucose, UA: NEGATIVE mg/dL
Hgb urine dipstick: NEGATIVE
Ketones, ur: NEGATIVE mg/dL
Nitrite: NEGATIVE
Protein, ur: NEGATIVE mg/dL
Specific Gravity, Urine: 1.02 (ref 1.005–1.030)
Urobilinogen, UA: 1 mg/dL (ref 0.0–1.0)
pH: 6 (ref 5.0–8.0)

## 2011-07-27 NOTE — Progress Notes (Signed)
U/S scheduled Jul 29, 2011 at 1030 am.

## 2011-07-27 NOTE — Progress Notes (Signed)
Pulse: 103

## 2011-07-27 NOTE — Progress Notes (Signed)
MRI nml, previa resolved--will allow TOLAC S>D will check u/s for growth

## 2011-07-27 NOTE — Telephone Encounter (Signed)
Pt was seen in clinic today, results given.

## 2011-07-28 ENCOUNTER — Ambulatory Visit (HOSPITAL_COMMUNITY): Payer: Self-pay

## 2011-07-29 ENCOUNTER — Ambulatory Visit (HOSPITAL_COMMUNITY)
Admission: RE | Admit: 2011-07-29 | Discharge: 2011-07-29 | Disposition: A | Discharge status: Home / Self Care | Payer: Medicaid Other | Source: Ambulatory Visit | Attending: Family Medicine | Admitting: Family Medicine

## 2011-07-29 DIAGNOSIS — O34219 Maternal care for unspecified type scar from previous cesarean delivery: Secondary | ICD-10-CM | POA: Insufficient documentation

## 2011-07-29 DIAGNOSIS — O3660X0 Maternal care for excessive fetal growth, unspecified trimester, not applicable or unspecified: Secondary | ICD-10-CM | POA: Insufficient documentation

## 2011-07-29 DIAGNOSIS — Z3689 Encounter for other specified antenatal screening: Secondary | ICD-10-CM | POA: Insufficient documentation

## 2011-08-05 ENCOUNTER — Ambulatory Visit (INDEPENDENT_AMBULATORY_CARE_PROVIDER_SITE_OTHER): Payer: Self-pay | Admitting: Advanced Practice Midwife

## 2011-08-05 VITALS — BP 135/87 | Temp 98.0°F | Wt 186.3 lb

## 2011-08-05 DIAGNOSIS — O34219 Maternal care for unspecified type scar from previous cesarean delivery: Secondary | ICD-10-CM

## 2011-08-05 LAB — POCT URINALYSIS DIP (DEVICE)
Glucose, UA: NEGATIVE mg/dL
Leukocytes, UA: NEGATIVE
Nitrite: NEGATIVE
Protein, ur: NEGATIVE mg/dL
Urobilinogen, UA: 1 mg/dL (ref 0.0–1.0)

## 2011-08-05 NOTE — Progress Notes (Signed)
Cervix unchanged, attempted sweeping.  Discussed TOLAC. Reviewed what to come in for.  Reviewed [place] results.

## 2011-08-05 NOTE — Patient Instructions (Signed)
Normal Labor and Delivery Your caregiver must first be sure you are in labor. Signs of labor include:  You may pass what is called "the mucus plug" before labor begins. This is a small amount of blood stained mucus.   Regular uterine contractions.   The time between contractions get closer together.   The discomfort and pain gradually gets more intense.   Pains are mostly located in the back.   Pains get worse when walking.   The cervix (the opening of the uterus becomes thinner (begins to efface) and opens up (dilates).  Once you are in labor and admitted into the hospital or care center, your caregiver will do the following:  A complete physical examination.   Check your vital signs (blood pressure, pulse, temperature and the fetal heart rate).   Do a vaginal examination (using a sterile glove and lubricant) to determine:   The position (presentation) of the baby (head [vertex] or buttock first).   The level (station) of the baby's head in the birth canal.   The effacement and dilatation of the cervix.   You may have your pubic hair shaved and be given an enema depending on your caregiver and the circumstance.   An electronic monitor is usually placed on your abdomen. The monitor follows the length and intensity of the contractions, as well as the baby's heart rate.   Usually, your caregiver will insert an IV in your arm with a bottle of sugar water. This is done as a precaution so that medications can be given to you quickly during labor or delivery.  NORMAL LABOR AND DELIVERY IS DIVIDED UP INTO 3 STAGES: First Stage This is when regular contractions begin and the cervix begins to efface and dilate. This stage can last from 3 to 15 hours. The end of the first stage is when the cervix is 100% effaced and 10 centimeters dilated. Pain medications may be given by   Injection (morphine, demerol, etc.)   Regional anesthesia (spinal, caudal or epidural, anesthetics given in  different locations of the spine). Paracervical pain medication may be given, which is an injection of and anesthetic on each side of the cervix.  A pregnant woman may request to have "Natural Childbirth" which is not to have any medications or anesthesia during her labor and delivery. Second Stage This is when the baby comes down through the birth canal (vagina) and is born. This can take 1 to 4 hours. As the baby's head comes down through the birth canal, you may feel like you are going to have a bowel movement. You will get the urge to bear down and push until the baby is delivered. As the baby's head is being delivered, the caregiver will decide if an episiotomy (a cut in the perineum and vagina area) is needed to prevent tearing of the tissue in this area. The episiotomy is sewn up after the delivery of the baby and placenta. Sometimes a mask with nitrous oxide is given for the mother to breath during the delivery of the baby to help if there is too much pain. The end of Stage 2 is when the baby is fully delivered. Then when the umbilical cord stops pulsating it is clamped and cut. Third Stage The third stage begins after the baby is completely delivered and ends after the placenta (afterbirth) is delivered. This usually takes 5 to 30 minutes. After the placenta is delivered, a medication is given either by intravenous or injection to help contract   the uterus and prevent bleeding. The third stage is not painful and pain medication is usually not necessary. If an episiotomy was done, it is repaired at this time. After the delivery, the mother is watched and monitored closely for 1 to 2 hours to make sure there is no postpartum bleeding (hemorrhage). If there is a lot of bleeding, medication is given to contract the uterus and stop the bleeding. Document Released: 12/03/2007 Document Revised: 02/12/2011 Document Reviewed: 12/03/2007 [employer] Patient Information 2012 [employer]. 

## 2011-08-05 NOTE — Progress Notes (Signed)
P=121, thinks may have had tdap at previous provider-

## 2011-08-11 ENCOUNTER — Encounter (HOSPITAL_COMMUNITY): Payer: Self-pay | Admitting: *Deleted

## 2011-08-11 ENCOUNTER — Inpatient Hospital Stay (HOSPITAL_COMMUNITY)
Admission: AD | Admit: 2011-08-11 | Discharge: 2011-08-14 | DRG: 766 | Disposition: A | Discharge status: Home / Self Care | Payer: Medicaid Other | Source: Ambulatory Visit | Attending: Obstetrics & Gynecology | Admitting: Obstetrics & Gynecology

## 2011-08-11 DIAGNOSIS — O34219 Maternal care for unspecified type scar from previous cesarean delivery: Secondary | ICD-10-CM | POA: Diagnosis present

## 2011-08-11 DIAGNOSIS — O324XX0 Maternal care for high head at term, not applicable or unspecified: Secondary | ICD-10-CM | POA: Diagnosis present

## 2011-08-11 DIAGNOSIS — R002 Palpitations: Secondary | ICD-10-CM | POA: Diagnosis present

## 2011-08-11 LAB — COMPREHENSIVE METABOLIC PANEL
ALT: 5 U/L (ref 0–35)
Albumin: 3.2 g/dL — ABNORMAL LOW (ref 3.5–5.2)
Alkaline Phosphatase: 199 U/L — ABNORMAL HIGH (ref 39–117)
Glucose, Bld: 79 mg/dL (ref 70–99)
Potassium: 4 mEq/L (ref 3.5–5.1)
Sodium: 137 mEq/L (ref 135–145)
Total Protein: 6.6 g/dL (ref 6.0–8.3)

## 2011-08-11 LAB — URINALYSIS, ROUTINE W REFLEX MICROSCOPIC
Ketones, ur: NEGATIVE mg/dL
Protein, ur: NEGATIVE mg/dL
Urobilinogen, UA: 2 mg/dL — ABNORMAL HIGH (ref 0.0–1.0)

## 2011-08-11 LAB — CBC
HCT: 37.9 % (ref 36.0–46.0)
MCHC: 34.6 g/dL (ref 30.0–36.0)
MCV: 90.9 fL (ref 78.0–100.0)
RDW: 14 % (ref 11.5–15.5)

## 2011-08-11 LAB — URINE MICROSCOPIC-ADD ON

## 2011-08-11 MED ORDER — IBUPROFEN 600 MG PO TABS: 600.0000 mg | ORAL_TABLET | Freq: Four times a day (QID) | ORAL | Status: DC | PRN

## 2011-08-11 MED ORDER — CALCIUM CARBONATE ANTACID 500 MG PO CHEW: 1.0000 | CHEWABLE_TABLET | Freq: Two times a day (BID) | ORAL | Status: DC

## 2011-08-11 MED ORDER — OXYTOCIN 20 UNITS IN LACTATED RINGERS INFUSION - SIMPLE: 125.0000 mL/h | Freq: Once | INTRAVENOUS | Status: DC

## 2011-08-11 MED ORDER — CITRIC ACID-SODIUM CITRATE 334-500 MG/5ML PO SOLN
30.0000 mL | ORAL | Status: DC | PRN
  Administered 2011-08-12: 30 mL via ORAL
  Filled 2011-08-11: qty 15

## 2011-08-11 MED ORDER — LIDOCAINE HCL (PF) 1 % IJ SOLN
30.0000 mL | INTRAMUSCULAR | Status: DC | PRN
  Filled 2011-08-11: qty 30

## 2011-08-11 MED ORDER — EPHEDRINE 5 MG/ML INJ: 10.0000 mg | INTRAVENOUS | Status: DC | PRN

## 2011-08-11 MED ORDER — NALBUPHINE SYRINGE 5 MG/0.5 ML
5.0000 mg | INJECTION | INTRAMUSCULAR | Status: AC
  Administered 2011-08-11: 5 mg via INTRAVENOUS
  Filled 2011-08-11: qty 0.5

## 2011-08-11 MED ORDER — EPHEDRINE 5 MG/ML INJ
10.0000 mg | INTRAVENOUS | Status: DC | PRN
  Filled 2011-08-11: qty 4

## 2011-08-11 MED ORDER — PHENYLEPHRINE 40 MCG/ML (10ML) SYRINGE FOR IV PUSH (FOR BLOOD PRESSURE SUPPORT): 80.0000 ug | PREFILLED_SYRINGE | INTRAVENOUS | Status: DC | PRN

## 2011-08-11 MED ORDER — PRENATAL MULTIVITAMIN CH
1.0000 | ORAL_TABLET | Freq: Every day | ORAL | Status: DC
  Administered 2011-08-12 – 2011-08-13 (×2): 1 via ORAL
  Filled 2011-08-11 (×3): qty 1

## 2011-08-11 MED ORDER — ACETAMINOPHEN 325 MG PO TABS: 650.0000 mg | ORAL_TABLET | ORAL | Status: DC | PRN

## 2011-08-11 MED ORDER — LACTATED RINGERS IV SOLN: 500.0000 mL | INTRAVENOUS | Status: DC | PRN

## 2011-08-11 MED ORDER — NALBUPHINE SYRINGE 5 MG/0.5 ML: 5.0000 mg | INJECTION | INTRAMUSCULAR | Status: DC | PRN

## 2011-08-11 MED ORDER — LACTATED RINGERS IV BOLUS (SEPSIS)
1000.0000 mL | Freq: Once | INTRAVENOUS | Status: AC
Start: 2011-08-11 — End: 2011-08-12
  Administered 2011-08-11: 1000 mL via INTRAVENOUS

## 2011-08-11 MED ORDER — OXYCODONE-ACETAMINOPHEN 5-325 MG PO TABS: 1.0000 | ORAL_TABLET | ORAL | Status: DC | PRN

## 2011-08-11 MED ORDER — CALCIUM CARBONATE 200 MG PO CAPS: 250.0000 mg | ORAL_CAPSULE | Freq: Two times a day (BID) | ORAL | Status: DC

## 2011-08-11 MED ORDER — OXYTOCIN BOLUS FROM INFUSION
500.0000 mL | Freq: Once | INTRAVENOUS | Status: DC
  Filled 2011-08-11: qty 500
  Filled 2011-08-11: qty 1000

## 2011-08-11 MED ORDER — FLEET ENEMA 7-19 GM/118ML RE ENEM: 1.0000 | ENEMA | RECTAL | Status: DC | PRN

## 2011-08-11 MED ORDER — LACTATED RINGERS IV SOLN
500.0000 mL | Freq: Once | INTRAVENOUS | Status: AC
  Administered 2011-08-11: 500 mL via INTRAVENOUS

## 2011-08-11 MED ORDER — DIPHENHYDRAMINE HCL 50 MG/ML IJ SOLN: 12.5000 mg | INTRAMUSCULAR | Status: DC | PRN

## 2011-08-11 MED ORDER — PHENYLEPHRINE 40 MCG/ML (10ML) SYRINGE FOR IV PUSH (FOR BLOOD PRESSURE SUPPORT)
80.0000 ug | PREFILLED_SYRINGE | INTRAVENOUS | Status: DC | PRN
  Filled 2011-08-11: qty 5

## 2011-08-11 MED ORDER — FENTANYL 2.5 MCG/ML BUPIVACAINE 1/10 % EPIDURAL INFUSION (WH - ANES)
14.0000 mL/h | INTRAMUSCULAR | Status: DC
  Administered 2011-08-12 (×4): 14 mL/h via EPIDURAL
  Filled 2011-08-11 (×4): qty 60

## 2011-08-11 MED ORDER — ONDANSETRON HCL 4 MG/2ML IJ SOLN: 4.0000 mg | Freq: Four times a day (QID) | INTRAMUSCULAR | Status: DC | PRN

## 2011-08-11 MED ORDER — LACTATED RINGERS IV SOLN
INTRAVENOUS | Status: DC
  Administered 2011-08-11: 125 mL/h via INTRAVENOUS

## 2011-08-11 NOTE — MAU Provider Note (Signed)
History    CSN: [id number] Arrival date and time: 08/11/11 1927  None  Chief Complaint  Patient presents with  . Labor Eval   HPI Comments: Pt presenting to MAU for frequent contractions and labor eval.  Contractions began 5 days ago following membrane sweeping in clinic.  Have become frequent; intensity is moderate.  Positive bloody show.   Feeling baby move well.    Followed in [place] prior to 07/09/2011.  Hx of  habitual aborter - BPP (8/8) and Anatomic U/S normal for growth.   ?Placenta Accreta on [place] with reported MRI that was normal per [facility].  No MRI report in records. Family History of HOCM - pt reports 19yo brother diagnosed in Spring of 2013.  Pt reports receiving EKG and 2D ECHO that were normal.  No prior sycopal events with exertion, no chest pain, hx of occasional palpitations but non-recently.  Pt reports being cleared by cardiology. Genetic Screen Neg quad Anatomic [place] Nml Glucose Screen 128 GBS  Feeding Preference Breast Contraception Undecided Circumcision female    OB History    Grav Para Term Preterm Abortions TAB SAB Ect Mult Living   5 1 1  3 1 2   1       Past Medical History  Diagnosis Date  . Abnormal Pap smear 2012  . Genital warts     Past Surgical History  Procedure Date  . Cesarean section 2009  . Dilation and curettage of uterus 2007    Family History  Problem Relation Age of Onset  . Heart disease Brother     HOCM dx at 20 yo  . Parkinsonism Brother     History  Substance Use Topics  . Smoking status: Never Smoker   . Smokeless tobacco: Never Used  . Alcohol Use: No    Allergies: No Known Allergies  Prescriptions prior to admission  Medication Sig Dispense Refill  . calcium carbonate 200 MG capsule Take 250 mg by mouth 2 (two) times daily with a meal.      . Prenatal Vit-Fe Fumarate-FA (PRENATAL MULTIVITAMIN) TABS Take 1 tablet by mouth at bedtime.       Review of Systems  Constitutional: Negative for fever and chills.  Eyes:  Negative for blurred vision and double vision.  Respiratory: Negative for cough, hemoptysis, sputum production and wheezing.   Cardiovascular: Positive for palpitations (history of but non in last 2-3 months). Negative for chest pain, orthopnea, claudication, leg swelling and PND.  Gastrointestinal: Negative for nausea, vomiting, constipation, blood in stool and melena.  Genitourinary: Negative.   Musculoskeletal: Negative.   Skin: Negative.   Neurological: Negative for dizziness, tingling and headaches.  Endo/Heme/Allergies: Negative.   Psychiatric/Behavioral: Negative.    Physical Exam   Blood pressure 137/74, pulse 91, temperature 99 F (37.2 C), temperature source Oral, resp. rate 18, height 5\' 6"  (1.676 m), weight 83.915 kg (185 lb), last menstrual period 11/07/2010.  Physical Exam  Constitutional: She is oriented to person, place, and time. She appears well-developed and well-nourished. No distress.  HENT:  Head: Normocephalic and atraumatic.  Eyes: Conjunctivae are normal. Right eye exhibits no discharge. Left eye exhibits no discharge. No scleral icterus.  Cardiovascular: Normal rate, regular rhythm, normal heart sounds and intact distal pulses.  Exam reveals no gallop and no friction rub.   No murmur heard.      Normal inspiratory/expiratory changes  Respiratory: Effort normal and breath sounds normal. No respiratory distress. She has no wheezes. She has no rales.  GI:  Soft. She exhibits distension and mass. There is no tenderness. There is no rebound and no guarding.  Genitourinary:       Deferred to nurse  Musculoskeletal: She exhibits no edema and no tenderness.  Neurological: She is alert and oriented to person, place, and time.  Skin: Skin is warm and dry. No rash noted. She is not diaphoretic. No erythema. No pallor.  Psychiatric: She has a normal mood and affect. Her behavior is normal. Judgment and thought content normal.    MAU Course   Procedures  MDM Contractions q 3-51minutes with initial cervical changes.  No indication for admission at this time for labor; but will continue to monitor in MAU for changes due to pending PIH labs and elevated BPs BPs elevated; PIH labs sent  Assessment and Plan  Cervical Changes noted in MAU will Admit, see H&P for further information  Thailan Sava 08/11/2011, 9:31 PM

## 2011-08-11 NOTE — MAU Note (Signed)
Pt presents with worsening contractions. Contractions x 5 days. G2P1. First pregnancy C/S for large baby.

## 2011-08-11 NOTE — H&P (Signed)
[name] [name] is a 21 y.o. female presenting for Onset of labor. Maternal Medical History:  Reason for admission: Reason for admission: contractions.  Fetal activity: Perceived fetal activity is normal.    Prenatal complications: no prenatal complications  Pt presenting to MAU for frequent contractions and labor eval. Contractions began 5 days ago following membrane sweeping in clinic. Have become frequent; intensity is moderate. Positive bloody show.  Feeling baby move well.  Followed in [place] prior to 07/09/2011. 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  ?Placenta Accreta on [place] with reported MRI that was normal per [facility]. No MRI report in records.  Family History of HOCM - pt reports 19yo brother diagnosed in Spring of 2013. Pt reports receiving EKG and 2D ECHO that were normal. No prior sycopal events with exertion, no chest pain, hx of occasional palpitations but non-recently. Pt reports being cleared by cardiology.  Genetic Screen Neg quad  Anatomic [place] Nml  Glucose Screen 128  GBS - Negative Feeding Preference Breast  Contraception Undecided  Circumcision female  OB History    Grav Para Term Preterm Abortions TAB SAB Ect Mult Living   5 1 1  3 1 2   1      Past Medical History  Diagnosis Date  . Abnormal Pap smear 2012  . Genital warts    Past Surgical History  Procedure Date  . Cesarean section 2009  . Dilation and curettage of uterus 2007   Family History: family history includes Heart disease in her brother and Parkinsonism in her brother. Social History:  reports that she has never smoked. She has never used smokeless tobacco. She reports that she does not drink alcohol or use illicit drugs.  ROS Review of Systems  Constitutional: Negative for fever and chills.  Eyes: Negative for blurred vision and double vision.  Respiratory: Negative for cough, hemoptysis, sputum production and wheezing.  Cardiovascular: Positive for  palpitations (history of but non in last 2-3 months). Negative for chest pain, orthopnea, claudication, leg swelling and PND.  Gastrointestinal: Negative for nausea, vomiting, constipation, blood in stool and melena.  Genitourinary: Negative.  Musculoskeletal: Negative.  Skin: Negative.  Neurological: Negative for dizziness, tingling and headaches.  Endo/Heme/Allergies: Negative.  Psychiatric/Behavioral: Negative.   Dilation: 5 Effacement (%): 100 Station: -1 Exam by:: L. Leftwich -Krby CNM Blood pressure 148/79, pulse 87, temperature 99 F (37.2 C), temperature source Oral, resp. rate 18, height 5\' 6"  (1.676 m), weight 83.915 kg (185 lb), last menstrual period 11/07/2010. Maternal Exam:  Uterine Assessment: Contraction strength is firm.  Contraction frequency is regular.   Abdomen: Patient reports no abdominal tenderness. Surgical scars: low transverse.   Fundal height is appropriate for gestational age.   Fetal presentation: vertex  Introitus: Normal vulva. Normal vagina.  Pelvis: adequate for delivery.      Fetal Exam Fetal Monitor Review: Baseline rate: 145.  Variability: moderate (6-25 bpm).   Pattern: accelerations present and no decelerations.    Fetal State Assessment: Category I - tracings are normal.     Physical Exam  Constitutional: She is oriented to person, place, and time. She appears well-developed and well-nourished. No distress.  HENT:  Head: Normocephalic and atraumatic.  Eyes: Conjunctivae are normal. Right eye exhibits no discharge. Left eye exhibits no discharge. No scleral icterus.  Cardiovascular: Normal rate, regular rhythm, normal heart sounds and intact distal pulses. Exam reveals no gallop and no friction rub.  No murmur heard. Normal inspiratory/expiratory changes  Respiratory: Effort normal and breath sounds normal. No respiratory distress. She has no wheezes. She has no rales.  GI: Soft. She exhibits distension and mass. There is no  tenderness. There is no rebound and no guarding.  Musculoskeletal: She exhibits no edema and no tenderness.  Neurological: She is alert and oriented to person, place, and time.  Skin: Skin is warm and dry. No rash noted. She is not diaphoretic. No erythema. No pallor.  Psychiatric: She has a normal mood and affect. Her behavior is normal. Judgment and thought content normal.   Prenatal labs: ABO, Rh: B/Positive/-- (01/28 0000) Antibody: Negative (01/28 0000) Rubella: Immune (01/28 0000) RPR: NON REAC (05/13 1109)  HBsAg: Negative (01/28 0000)  HIV: NON REACTIVE (05/13 1109)  GBS: Negative (05/13 0000)   [place] on 5/22 EFW - 4107 - 90%  Assessment/Plan: To L&D for expectant management for TOLAC with LGA infant Pt requesting epidural   Andrena Mews, DO [facility] Resident - PGY-1 08/11/2011 11:33 PM

## 2011-08-12 ENCOUNTER — Encounter (HOSPITAL_COMMUNITY)
Admission: AD | Disposition: A | Discharge status: Home / Self Care | Payer: Self-pay | Source: Ambulatory Visit | Attending: Obstetrics & Gynecology

## 2011-08-12 ENCOUNTER — Encounter: Payer: Self-pay | Admitting: Advanced Practice Midwife

## 2011-08-12 ENCOUNTER — Encounter (HOSPITAL_COMMUNITY): Payer: Self-pay | Admitting: *Deleted

## 2011-08-12 ENCOUNTER — Inpatient Hospital Stay (HOSPITAL_COMMUNITY): Payer: Medicaid Other | Admitting: Anesthesiology

## 2011-08-12 ENCOUNTER — Encounter (HOSPITAL_COMMUNITY): Payer: Self-pay | Admitting: Anesthesiology

## 2011-08-12 DIAGNOSIS — Z3689 Encounter for other specified antenatal screening: Secondary | ICD-10-CM

## 2011-08-12 DIAGNOSIS — O34219 Maternal care for unspecified type scar from previous cesarean delivery: Secondary | ICD-10-CM

## 2011-08-12 DIAGNOSIS — O3660X0 Maternal care for excessive fetal growth, unspecified trimester, not applicable or unspecified: Secondary | ICD-10-CM

## 2011-08-12 LAB — CBC
MCHC: 34.1 g/dL (ref 30.0–36.0)
RDW: 13.8 % (ref 11.5–15.5)

## 2011-08-12 SURGERY — Surgical Case
Anesthesia: Epidural | Site: Abdomen | Wound class: Clean Contaminated

## 2011-08-12 MED ORDER — METOCLOPRAMIDE HCL 5 MG/ML IJ SOLN: 10.0000 mg | Freq: Once | INTRAMUSCULAR | Status: DC | PRN

## 2011-08-12 MED ORDER — FENTANYL CITRATE 0.05 MG/ML IJ SOLN
INTRAMUSCULAR | Status: DC | PRN
  Administered 2011-08-12: 100 ug via INTRAVENOUS

## 2011-08-12 MED ORDER — OXYTOCIN 10 UNIT/ML IJ SOLN
20.0000 [IU] | INTRAVENOUS | Status: DC | PRN
  Administered 2011-08-12: 20 [IU] via INTRAVENOUS

## 2011-08-12 MED ORDER — DIPHENHYDRAMINE HCL 25 MG PO CAPS: 25.0000 mg | ORAL_CAPSULE | Freq: Four times a day (QID) | ORAL | Status: DC | PRN

## 2011-08-12 MED ORDER — DIBUCAINE 1 % RE OINT: 1.0000 "application " | TOPICAL_OINTMENT | RECTAL | Status: DC | PRN

## 2011-08-12 MED ORDER — DIPHENHYDRAMINE HCL 25 MG PO CAPS
25.0000 mg | ORAL_CAPSULE | ORAL | Status: DC | PRN
  Filled 2011-08-12: qty 1

## 2011-08-12 MED ORDER — FENTANYL CITRATE 0.05 MG/ML IJ SOLN
INTRAMUSCULAR | Status: AC
  Filled 2011-08-12: qty 2

## 2011-08-12 MED ORDER — OXYTOCIN 20 UNITS IN LACTATED RINGERS INFUSION - SIMPLE
125.0000 mL/h | INTRAVENOUS | Status: AC
  Administered 2011-08-12: 125 mL/h via INTRAVENOUS

## 2011-08-12 MED ORDER — SIMETHICONE 80 MG PO CHEW: 80.0000 mg | CHEWABLE_TABLET | ORAL | Status: DC | PRN

## 2011-08-12 MED ORDER — MEPERIDINE HCL 25 MG/ML IJ SOLN: 6.2500 mg | INTRAMUSCULAR | Status: DC | PRN

## 2011-08-12 MED ORDER — LIDOCAINE HCL (PF) 1 % IJ SOLN
INTRAMUSCULAR | Status: DC | PRN
  Administered 2011-08-12 (×2): 5 mL

## 2011-08-12 MED ORDER — LANOLIN HYDROUS EX OINT: 1.0000 "application " | TOPICAL_OINTMENT | CUTANEOUS | Status: DC | PRN

## 2011-08-12 MED ORDER — CEFAZOLIN SODIUM-DEXTROSE 2-3 GM-% IV SOLR
2.0000 g | Freq: Once | INTRAVENOUS | Status: AC
  Administered 2011-08-12: 2 g via INTRAVENOUS
  Filled 2011-08-12: qty 50

## 2011-08-12 MED ORDER — NALBUPHINE SYRINGE 5 MG/0.5 ML
5.0000 mg | INJECTION | INTRAMUSCULAR | Status: DC | PRN
  Administered 2011-08-12: 10 mg via INTRAVENOUS
  Filled 2011-08-12 (×2): qty 1

## 2011-08-12 MED ORDER — NALBUPHINE SYRINGE 5 MG/0.5 ML
5.0000 mg | INJECTION | INTRAMUSCULAR | Status: DC | PRN
  Filled 2011-08-12: qty 1

## 2011-08-12 MED ORDER — TETANUS-DIPHTH-ACELL PERTUSSIS 5-2.5-18.5 LF-MCG/0.5 IM SUSP: 0.5000 mL | Freq: Once | INTRAMUSCULAR | Status: DC

## 2011-08-12 MED ORDER — DIPHENHYDRAMINE HCL 50 MG/ML IJ SOLN
12.5000 mg | INTRAMUSCULAR | Status: DC | PRN
  Administered 2011-08-12: 12.5 mg via INTRAVENOUS
  Filled 2011-08-12: qty 1

## 2011-08-12 MED ORDER — OXYTOCIN 20 UNITS IN LACTATED RINGERS INFUSION - SIMPLE
INTRAVENOUS | Status: AC
  Filled 2011-08-12: qty 1000

## 2011-08-12 MED ORDER — PHENYLEPHRINE HCL 10 MG/ML IJ SOLN
INTRAMUSCULAR | Status: DC | PRN
  Administered 2011-08-12: 80 ug via INTRAVENOUS

## 2011-08-12 MED ORDER — CEFAZOLIN SODIUM 1-5 GM-% IV SOLN
INTRAVENOUS | Status: AC
  Filled 2011-08-12: qty 100

## 2011-08-12 MED ORDER — FENTANYL CITRATE 0.05 MG/ML IJ SOLN: 25.0000 ug | INTRAMUSCULAR | Status: DC | PRN

## 2011-08-12 MED ORDER — MORPHINE SULFATE (PF) 0.5 MG/ML IJ SOLN
INTRAMUSCULAR | Status: DC | PRN
  Administered 2011-08-12: 1 mg via INTRAVENOUS

## 2011-08-12 MED ORDER — SODIUM CHLORIDE 0.9 % IV SOLN
1.0000 ug/kg/h | INTRAVENOUS | Status: DC | PRN
  Filled 2011-08-12: qty 2.5

## 2011-08-12 MED ORDER — SODIUM BICARBONATE 8.4 % IV SOLN
INTRAVENOUS | Status: AC
  Filled 2011-08-12: qty 50

## 2011-08-12 MED ORDER — DIPHENHYDRAMINE HCL 50 MG/ML IJ SOLN: 25.0000 mg | INTRAMUSCULAR | Status: DC | PRN

## 2011-08-12 MED ORDER — SODIUM BICARBONATE 8.4 % IV SOLN
INTRAVENOUS | Status: DC | PRN
  Administered 2011-08-12: 10 mL via EPIDURAL

## 2011-08-12 MED ORDER — SIMETHICONE 80 MG PO CHEW
80.0000 mg | CHEWABLE_TABLET | Freq: Three times a day (TID) | ORAL | Status: DC
  Administered 2011-08-12 – 2011-08-14 (×7): 80 mg via ORAL

## 2011-08-12 MED ORDER — LIDOCAINE-EPINEPHRINE (PF) 2 %-1:200000 IJ SOLN
INTRAMUSCULAR | Status: AC
  Filled 2011-08-12: qty 20

## 2011-08-12 MED ORDER — ZOLPIDEM TARTRATE 5 MG PO TABS: 5.0000 mg | ORAL_TABLET | Freq: Every evening | ORAL | Status: DC | PRN

## 2011-08-12 MED ORDER — KETOROLAC TROMETHAMINE 30 MG/ML IJ SOLN: 30.0000 mg | Freq: Four times a day (QID) | INTRAMUSCULAR | Status: AC | PRN

## 2011-08-12 MED ORDER — HYDROMORPHONE HCL PF 1 MG/ML IJ SOLN
1.0000 mg | Freq: Once | INTRAMUSCULAR | Status: AC
  Administered 2011-08-12: 1 mg via INTRAVENOUS
  Filled 2011-08-12: qty 1

## 2011-08-12 MED ORDER — MORPHINE SULFATE (PF) 0.5 MG/ML IJ SOLN
INTRAMUSCULAR | Status: DC | PRN
  Administered 2011-08-12: 4 mg via EPIDURAL

## 2011-08-12 MED ORDER — MENTHOL 3 MG MT LOZG: 1.0000 | LOZENGE | OROMUCOSAL | Status: DC | PRN

## 2011-08-12 MED ORDER — OXYTOCIN 10 UNIT/ML IJ SOLN
INTRAMUSCULAR | Status: AC
  Filled 2011-08-12: qty 2

## 2011-08-12 MED ORDER — METHYLERGONOVINE MALEATE 0.2 MG/ML IJ SOLN
INTRAMUSCULAR | Status: AC
  Filled 2011-08-12: qty 1

## 2011-08-12 MED ORDER — ONDANSETRON HCL 4 MG/2ML IJ SOLN: 4.0000 mg | INTRAMUSCULAR | Status: DC | PRN

## 2011-08-12 MED ORDER — ONDANSETRON HCL 4 MG/2ML IJ SOLN
INTRAMUSCULAR | Status: DC | PRN
  Administered 2011-08-12: 4 mg via INTRAVENOUS

## 2011-08-12 MED ORDER — WITCH HAZEL-GLYCERIN EX PADS: 1.0000 "application " | MEDICATED_PAD | CUTANEOUS | Status: DC | PRN

## 2011-08-12 MED ORDER — SCOPOLAMINE 1 MG/3DAYS TD PT72: 1.0000 | MEDICATED_PATCH | Freq: Once | TRANSDERMAL | Status: DC

## 2011-08-12 MED ORDER — MORPHINE SULFATE 0.5 MG/ML IJ SOLN
INTRAMUSCULAR | Status: AC
  Filled 2011-08-12: qty 10

## 2011-08-12 MED ORDER — IBUPROFEN 600 MG PO TABS
600.0000 mg | ORAL_TABLET | Freq: Four times a day (QID) | ORAL | Status: DC
  Administered 2011-08-12 – 2011-08-14 (×7): 600 mg via ORAL
  Filled 2011-08-12 (×4): qty 1

## 2011-08-12 MED ORDER — ONDANSETRON HCL 4 MG/2ML IJ SOLN: 4.0000 mg | Freq: Three times a day (TID) | INTRAMUSCULAR | Status: DC | PRN

## 2011-08-12 MED ORDER — NALOXONE HCL 0.4 MG/ML IJ SOLN: 0.4000 mg | INTRAMUSCULAR | Status: DC | PRN

## 2011-08-12 MED ORDER — ONDANSETRON HCL 4 MG/2ML IJ SOLN
INTRAMUSCULAR | Status: AC
  Filled 2011-08-12: qty 2

## 2011-08-12 MED ORDER — PRENATAL MULTIVITAMIN CH: 1.0000 | ORAL_TABLET | Freq: Every day | ORAL | Status: DC

## 2011-08-12 MED ORDER — SENNOSIDES-DOCUSATE SODIUM 8.6-50 MG PO TABS
2.0000 | ORAL_TABLET | Freq: Every day | ORAL | Status: DC
  Administered 2011-08-12 – 2011-08-13 (×2): 2 via ORAL

## 2011-08-12 MED ORDER — IBUPROFEN 600 MG PO TABS
600.0000 mg | ORAL_TABLET | Freq: Four times a day (QID) | ORAL | Status: DC | PRN
  Filled 2011-08-12 (×4): qty 1

## 2011-08-12 MED ORDER — OXYCODONE-ACETAMINOPHEN 5-325 MG PO TABS
1.0000 | ORAL_TABLET | ORAL | Status: DC | PRN
  Administered 2011-08-13: 1 via ORAL
  Administered 2011-08-13 – 2011-08-14 (×3): 2 via ORAL
  Filled 2011-08-12: qty 2
  Filled 2011-08-12: qty 1
  Filled 2011-08-12 (×2): qty 2

## 2011-08-12 MED ORDER — KETOROLAC TROMETHAMINE 60 MG/2ML IM SOLN
60.0000 mg | Freq: Once | INTRAMUSCULAR | Status: AC | PRN
  Filled 2011-08-12: qty 2

## 2011-08-12 MED ORDER — SODIUM CHLORIDE 0.9 % IJ SOLN: 3.0000 mL | INTRAMUSCULAR | Status: DC | PRN

## 2011-08-12 MED ORDER — LACTATED RINGERS IV SOLN
INTRAVENOUS | Status: DC | PRN
  Administered 2011-08-12 (×2): via INTRAVENOUS

## 2011-08-12 MED ORDER — MISOPROSTOL 200 MCG PO TABS
ORAL_TABLET | ORAL | Status: AC
  Filled 2011-08-12: qty 4

## 2011-08-12 MED ORDER — EPHEDRINE SULFATE 50 MG/ML IJ SOLN
INTRAMUSCULAR | Status: DC | PRN
  Administered 2011-08-12 (×3): 10 mg via INTRAVENOUS

## 2011-08-12 MED ORDER — METOCLOPRAMIDE HCL 5 MG/ML IJ SOLN
10.0000 mg | Freq: Three times a day (TID) | INTRAMUSCULAR | Status: DC | PRN
  Administered 2011-08-12: 10 mg via INTRAVENOUS
  Filled 2011-08-12: qty 2

## 2011-08-12 MED ORDER — ONDANSETRON HCL 4 MG PO TABS: 4.0000 mg | ORAL_TABLET | ORAL | Status: DC | PRN

## 2011-08-12 MED ORDER — LACTATED RINGERS IV SOLN
INTRAVENOUS | Status: DC
  Administered 2011-08-12: 19:00:00 via INTRAVENOUS

## 2011-08-12 SURGICAL SUPPLY — 17 items
CHLORAPREP W/TINT 26ML (MISCELLANEOUS) ×2 IMPLANT
DRESSING TELFA 8X3 (GAUZE/BANDAGES/DRESSINGS) ×2 IMPLANT
ELECT REM PT RETURN 9FT ADLT (ELECTROSURGICAL) ×2
ELECTRODE REM PT RTRN 9FT ADLT (ELECTROSURGICAL) ×1 IMPLANT
GAUZE SPONGE 4X4 12PLY STRL LF (GAUZE/BANDAGES/DRESSINGS) ×4 IMPLANT
GLOVE BIOGEL PI IND STRL 6.5 (GLOVE) ×2 IMPLANT
GLOVE BIOGEL PI INDICATOR 6.5 (GLOVE) ×2
GLOVE SURG SS PI 6.0 STRL IVOR (GLOVE) ×2 IMPLANT
GOWN PREVENTION PLUS LG XLONG (DISPOSABLE) ×6 IMPLANT
NS IRRIG 1000ML POUR BTL (IV SOLUTION) ×2 IMPLANT
PACK C SECTION WH (CUSTOM PROCEDURE TRAY) ×2 IMPLANT
PAD ABD 7.5X8 STRL (GAUZE/BANDAGES/DRESSINGS) ×2 IMPLANT
RTRCTR C-SECT PINK 25CM LRG (MISCELLANEOUS) ×2 IMPLANT
STAPLER VISISTAT 35W (STAPLE) ×2 IMPLANT
SUT VIC AB 0 CT1 36 (SUTURE) ×8 IMPLANT
TOWEL OR 17X24 6PK STRL BLUE (TOWEL DISPOSABLE) ×4 IMPLANT
TRAY FOLEY CATH 14FR (SET/KITS/TRAYS/PACK) ×2 IMPLANT

## 2011-08-12 NOTE — Anesthesia Postprocedure Evaluation (Signed)
    Anesthesia Post Note  Patient: [name] [name]  Procedure(s) Performed: Procedure(s) (LRB): CESAREAN SECTION (N/A)  Anesthesia type: Epidural  Patient location: Mother/Baby  Post pain: Pain level controlled  Post assessment: Post-op Vital signs reviewed  Last Vitals:  Filed Vitals:   08/12/11 1528  BP: 124/63  Pulse: 99  Temp: 37 C  Resp: 18    Post vital signs: Reviewed  Level of consciousness:alert  Complications: No apparent anesthesia complications

## 2011-08-12 NOTE — Op Note (Signed)
[name] PROCEDURE DATE: 08/12/2011  PREOPERATIVE DIAGNOSIS: Intrauterine pregnancy at  [redacted]w[redacted]d weeks gestation for repeat cesarean section secondary to failure to descent  POSTOPERATIVE DIAGNOSIS: The same  PROCEDURE:     Cesarean Section  SURGEON:  Dr. Catalina Antigua  ASSISTANT: none  INDICATIONS: [name] [name] is a 21 y.o. [id number] at [redacted]w[redacted]d for repeat cesarean section secondary to failure to descent. Patient presented in labor with plans to have a trial of labor after cesarean. Patient progressed to fully dilated and +2 station and requested repeat cesarean section after 3 hours of pushing. The risks of cesarean section discussed with the patient included but were not limited to: bleeding which may require transfusion or reoperation; infection which may require antibiotics; injury to bowel, bladder, ureters or other surrounding organs; injury to the fetus; need for additional procedures including hysterectomy in the event of a life-threatening hemorrhage; placental abnormalities wth subsequent pregnancies, incisional problems, thromboembolic phenomenon and other postoperative/anesthesia complications. The patient concurred with the proposed plan, giving informed written consent for the procedure.    FINDINGS:  Viable female infant in cephalic presentation.  Apgars 7 and 8, weight not determined at time of witting this note.  Clear amniotic fluid.  Intact placenta, three vessel cord.  Normal uterus, fallopian tubes and ovaries bilaterally.  ANESTHESIA:    Spinal INTRAVENOUS FLUIDS: 1000 ml ESTIMATED BLOOD LOSS: 700 ml URINE OUTPUT:  200 ml SPECIMENS: Placenta sent to pathology/L&D COMPLICATIONS: None immediate  PROCEDURE IN DETAIL:  The patient received intravenous antibiotics and had sequential compression devices applied to her lower extremities while in the preoperative area.  She was then taken to the operating room where anesthesia was induced and was found to be adequate.  A foley catheter was placed into her bladder and attached to Wonder [place]away gravity. She was then placed in a dorsal supine position with a leftward tilt, and prepped and draped in a sterile manner. After an adequate timeout was performed, a Pfannenstiel skin incision was made with scalpel and carried through to the underlying layer of fascia. The fascia was incised in the midline and this incision was extended bilaterally using the Mayo scissors. Kocher clamps were applied to the superior aspect of the fascial incision and the underlying rectus muscles were dissected off bluntly. A similar process was carried out on the inferior aspect of the facial incision. The rectus muscles were separated in the midline bluntly and the peritoneum was entered bluntly. Attention was turned to the lower uterine segment where a bladder flap was created, and a transverse hysterotomy was made with a scalpel and extended bilaterally bluntly. The bladder blade was then removed. The infant was successfully delivered, and cord was clamped and cut and infant was handed over to awaiting neonatology team. Uterine massage was then administered and the placenta delivered intact with three-vessel cord. The uterus was then cleared of clot and debris.  The hysterotomy was closed with 0 Vicryl in a running locked fashion, and an imbricating layer was also placed using the same suture. Overall, excellent hemostasis was noted. The pelvis was copiously irrigated and cleared of all clot and debris. Hemostasis was confirmed on all surfaces.  The peritoneum and the muscles were reapproximated using 0 vicryl interrupted stitches. The fascia was then closed using 0 Vicryl in a running locked fashion.  The skin was closed with staples. The patient tolerated the procedure well. Sponge, lap, instrument and needle counts were correct x 2. She was taken to the recovery room in stable  condition.    [name],PEGGYMD  08/12/2011 9:59 AM

## 2011-08-12 NOTE — Anesthesia Preprocedure Evaluation (Addendum)
Anesthesia Evaluation  Patient identified by MRN, date of birth, ID band Patient awake    Reviewed: Allergy & Precautions, H&P , NPO status , Patient's Chart, lab work & pertinent test results  Airway Mallampati: II TM Distance: >3 FB Neck ROM: full    Dental No notable dental hx.    Pulmonary neg pulmonary ROS,  breath sounds clear to auscultation  Pulmonary exam normal       Cardiovascular negative cardio ROS  Rhythm:regular Rate:Normal     Neuro/Psych negative neurological ROS  negative psych ROS   GI/Hepatic negative GI ROS, Neg liver ROS,   Endo/Other  negative endocrine ROS  Renal/GU negative Renal ROS     Musculoskeletal   Abdominal   Peds  Hematology negative hematology ROS (+)   Anesthesia Other Findings   Reproductive/Obstetrics (+) Pregnancy                          Anesthesia Physical Anesthesia Plan  ASA: II and Emergent  Anesthesia Plan: Epidural   Post-op Pain Management:    Induction:   Airway Management Planned: Natural Airway  Additional Equipment:   Intra-op Plan:   Post-operative Plan:   Informed Consent: I have reviewed the patients History and Physical, chart, labs and discussed the procedure including the risks, benefits and alternatives for the proposed anesthesia with the patient or authorized representative who has indicated his/her understanding and acceptance.     Plan Discussed with:   Anesthesia Plan Comments:        Anesthesia Quick Evaluation

## 2011-08-12 NOTE — Progress Notes (Signed)
[name] [name] is a 21 y.o. [phone] at [redacted]w[redacted]d by  Subjective: Lots of pressure, urge to push,   Objective: BP 141/68  Pulse 83  Temp(Src) 98.3 F (36.8 C) (Oral)  Resp 18  Ht 5\' 6"  (1.676 m)  Wt 83.915 kg (185 lb)  BMI 29.86 kg/m2  LMP 11/07/2010      FHT:  FHR: 135 bpm, variability: minimal ,  accelerations:  Abscent,  decelerations:  Absent UC:   regular, every 1-2 minutes SVE:   Dilation 10 Effacement 100% Station +2 Presentation: Vertex Exam by: Alonza Smoker, RN  Labs: Lab Results  Component Value Date   WBC 9.6 08/11/2011   HGB 13.1 08/11/2011   HCT 37.9 08/11/2011   MCV 90.9 08/11/2011   PLT 143* 08/11/2011    Assessment / Plan: Spontaneous labor, progressing normally Pt in second stage of labor  Labor: Progressing normally Preeclampsia:  Pr:Cr normal Fetal Wellbeing:  Category II Pain Control:  Epidural I/D:  n/a Anticipated MOD:  NSVD  [name] [name] 08/12/2011, 5:43 AM

## 2011-08-12 NOTE — Anesthesia Postprocedure Evaluation (Signed)
  Anesthesia Post-op Note  Patient: [name] [name]  Procedure(s) Performed: Procedure(s) (LRB): CESAREAN SECTION (N/A)  Patient Location: PACU  Anesthesia Type: Epidural  Level of Consciousness: awake, alert  and oriented  Airway and Oxygen Therapy: Patient Spontanous Breathing  Post-op Pain: none  Post-op Assessment: Post-op Vital signs reviewed, Patient's Cardiovascular Status Stable, Respiratory Function Stable, Patent Airway, No signs of Nausea or vomiting and Pain level controlled  Post-op Vital Signs: Reviewed and stable  Complications: No apparent anesthesia complications

## 2011-08-12 NOTE — Anesthesia Procedure Notes (Signed)
Epidural Patient location during procedure: OB Start time: 08/12/2011 12:14 AM  Staffing Anesthesiologist: Brayton Caves R Performed by: anesthesiologist   Preanesthetic Checklist Completed: patient identified, site marked, surgical consent, pre-op evaluation, timeout performed, IV checked, risks and benefits discussed and monitors and equipment checked  Epidural Patient position: sitting Prep: site prepped and draped and DuraPrep Patient monitoring: continuous pulse ox and blood pressure Approach: midline Injection technique: LOR air and LOR saline  Needle:  Needle type: Tuohy  Needle gauge: 17 G Needle length: 9 cm Needle insertion depth: 5 cm cm Catheter type: closed end flexible Catheter size: 19 Gauge Catheter at skin depth: 10 cm Test dose: negative  Assessment Events: blood not aspirated, injection not painful, no injection resistance, negative IV test and no paresthesia  Additional Notes Patient identified.  Risk benefits discussed including failed block, incomplete pain control, headache, nerve damage, paralysis, blood pressure changes, nausea, vomiting, reactions to medication both toxic or allergic, and postpartum back pain.  Patient expressed understanding and wished to proceed.  All questions were answered.  Sterile technique used throughout procedure and epidural site dressed with sterile barrier dressing. No paresthesia or other complications noted.The patient did not experience any signs of intravascular injection such as tinnitus or metallic taste in mouth nor signs of intrathecal spread such as rapid motor block. Please see nursing notes for vital signs.

## 2011-08-12 NOTE — Progress Notes (Signed)
[name] [name] is a 21 y.o. [phone] at [redacted]w[redacted]d by LMP admitted for active labor, TOLAC  Subjective: Pt pushing well, with good pain control with epidural.  Objective: BP 142/71  Pulse 102  Temp(Src) 99.4 F (37.4 C) (Oral)  Resp 20  Ht 5\' 6"  (1.676 m)  Wt 83.915 kg (185 lb)  BMI 29.86 kg/m2  LMP 11/07/2010      FHT:  FHR: 145 bpm, variability: moderate,  accelerations:  Present,  decelerations:  Absent UC:   regular, every 3 minutes SVE:   Dilation: 10 Effacement (%): 100 Station: +2 Exam by:: [employer], RN  Fetal head easily visible with pushes at this time, pt pushing effectively.   Labs: Lab Results  Component Value Date   WBC 9.6 08/11/2011   HGB 13.1 08/11/2011   HCT 37.9 08/11/2011   MCV 90.9 08/11/2011   PLT 143* 08/11/2011    Assessment / Plan: Spontaneous labor, progressing normally  Labor: Progressing normally Preeclampsia:  Elevated BP on admission, normal labs, PC ratio 0.07 Fetal Wellbeing:  Category I Pain Control:  Epidural I/D:  n/a Anticipated MOD:  VBAC  [name] [name] 08/12/2011, 9:08 AM

## 2011-08-12 NOTE — Progress Notes (Signed)
Pt called out & stated her water broke.

## 2011-08-12 NOTE — Progress Notes (Signed)
Patient ID: [name] [name], female   DOB: 1990/07/11, 21 y.o.   MRN: [id number]  [name] [name] is a 21 y.o. [phone] at [redacted]w[redacted]d admitted for SOL, desiring TOLAC Now is requesting C-section  Subjective: Comfortable; has urge to push  Objective: BP 142/71  Pulse 102  Temp(Src) 99.4 F (37.4 C) (Oral)  Resp 20  Ht 5\' 6"  (1.676 m)  Wt 83.915 kg (185 lb)  BMI 29.86 kg/m2  LMP 11/07/2010  Fetal Heart Rate: 160-165 baseline, 10 bpm accels, reactive earlier, moderate variability, mild pushing variables  Contractions: decreased interval to q 3-4 min  SVE:   Dilation: 10 Effacement (%): 100 Station: +2 Exam by:: [employer], RN  VE: large caput, pushes to +2 with good effort, clear AF  Assessment / Plan:  Labor: Prolonged 2nd stage, LGA (with AC 37 two wks ago) -> Dr. Jolayne Panther aware of RF, delivery not imminent and request for rpt c-section Chorioamnionitis -> Ancef 2G Fetal Wellbeing: reassuring fetal parameters Pain Control:  adequate Expected mode of delivery: c-section  [name] [name] 08/12/2011, 9:29 AM

## 2011-08-12 NOTE — Progress Notes (Signed)
Notified Dr. Sheral Apley that pt is very uncomfortable despite 4 Epidural Boluses. MD is in the OR with a c/s case, will have to call MD back.

## 2011-08-12 NOTE — Progress Notes (Signed)
Dr. Sheral Apley @ pt's bedside to redose epidural.  Pt states vaginal pain is much improved, pt is now feeling rectal pressure. MD decided not to redose pt @ this time. Will continue to monitor & call MD back if vaginal pain returns.

## 2011-08-12 NOTE — Progress Notes (Signed)
Spoke with Dr. Berline Chough about inserting Foley Catheter post epidural placement due to TOLAC & known Macrosomia with prior pregnancy & this pregnancy. Per MD request, will place foley catheter.

## 2011-08-12 NOTE — Progress Notes (Signed)
[name] [name] is a 21 y.o. [id number] at [redacted]w[redacted]d by LMP and [place] for SOL for TOLAC Subjective: Pt reports water just broke.  No complaints.  Resting but not sleeping   Objective: BP 128/53  Pulse 101  Temp(Src) 98.3 F (36.8 C) (Oral)  Resp 18  Ht 5\' 6"  (1.676 m)  Wt 83.915 kg (185 lb)  BMI 29.86 kg/m2  LMP 11/07/2010     FHT:  FHR: 135 bpm, variability: moderate,  accelerations:  Present,  decelerations:  Absent UC:   regular, every 2-3 minutes SVE:   Dilation: 5.5 Effacement (%): 100 Station: -1 Exam by:: [employer], RN    Labs: Lab Results  Component Value Date   WBC 9.6 08/11/2011   HGB 13.1 08/11/2011   HCT 37.9 08/11/2011   MCV 90.9 08/11/2011   PLT 143* 08/11/2011    Assessment / Plan: Spontaneous labor, progressing normally  BP improved with pain control Urine Culture collected and pending  Labor: Progressing normally Preeclampsia:  Pr:Cr pending, BP appropriate; LFTs WNL with exception of AlkPhos elevated to 200 Fetal Wellbeing:  Category I Pain Control:  Epidural I/D:  n/a Anticipated MOD:  NSVD  Andrena Mews, DO [facility] Resident - PGY-1 08/12/2011 2:11 AM

## 2011-08-12 NOTE — Transfer of Care (Signed)
Immediate Anesthesia Transfer of Care Note  Patient: [name] [name]  Procedure(s) Performed: Procedure(s) (LRB): CESAREAN SECTION (N/A)  Patient Location: PACU  Anesthesia Type: Epidural  Level of Consciousness: awake, alert  and oriented  Airway & Oxygen Therapy: Patient Spontanous Breathing  Post-op Assessment: Report given to PACU RN and Post -op Vital signs reviewed and stable  Post vital signs: Reviewed and stable  Complications: No apparent anesthesia complications

## 2011-08-12 NOTE — Progress Notes (Signed)
Platelets called to Dr. Malen Gauze, okay to dc epidural and transfer to floor.

## 2011-08-13 ENCOUNTER — Encounter (HOSPITAL_COMMUNITY): Payer: Self-pay | Admitting: Obstetrics and Gynecology

## 2011-08-13 LAB — CBC
Platelets: 121 10*3/uL — ABNORMAL LOW (ref 150–400)
RBC: 3.83 MIL/uL — ABNORMAL LOW (ref 3.87–5.11)
WBC: 11.8 10*3/uL — ABNORMAL HIGH (ref 4.0–10.5)

## 2011-08-13 LAB — URINE CULTURE
Colony Count: NO GROWTH
Culture  Setup Time: 201306050619

## 2011-08-13 NOTE — Progress Notes (Addendum)
Subjective: Postpartum Day 1: Cesarean Delivery Patient reports incisional pain, tolerating PO, + flatus and no problems voiding.    Objective: Vital signs in last 24 hours: Temp:  [97.9 F (36.6 C)-101.5 F (38.6 C)] 97.9 F (36.6 C) (06/06 0530) Pulse Rate:  [66-112] 66  (06/06 0530) Resp:  [16-21] 18  (06/06 0530) BP: (108-161)/(57-84) 145/83 mmHg (06/06 0530) SpO2:  [93 %-99 %] 95 % (06/06 0530)  Physical Exam:  General: alert, cooperative, appears stated age and no distress Lochia: appropriate Uterine Fundus: firm Incision: healing well, no significant drainage, no dehiscence, no significant erythema DVT Evaluation: No evidence of DVT seen on physical exam. Negative Homan's sign. No cords or calf tenderness. No significant calf/ankle edema.   Basename 08/13/11 0528 08/12/11 1125  HGB 11.8* 12.5  HCT 35.8* 36.7    Assessment/Plan: Status post Cesarean section. Doing well postoperatively.  Continue current care. BP slightly elevated to 145/83.  Stopping IVFs and continue to monitor closely.  Q2 hour vitals X 3 then per policy.  Andrena Mews, DO [facility] Resident - PGY-1 08/13/2011 7:48 AM  Pt seen and examined.  Agree with above note.

## 2011-08-13 NOTE — Progress Notes (Signed)
UR chart review completed.  

## 2011-08-14 MED ORDER — OXYCODONE-ACETAMINOPHEN 5-325 MG PO TABS: 1.0000 | ORAL_TABLET | ORAL | Status: AC | PRN

## 2011-08-14 MED ORDER — IBUPROFEN 600 MG PO TABS: 600.0000 mg | ORAL_TABLET | Freq: Four times a day (QID) | ORAL | Status: AC

## 2011-08-14 NOTE — Discharge Summary (Signed)
Obstetric Discharge Summary Reason for Admission: onset of labor Prenatal Procedures: ultrasound and BPP, Pelvic MRI - no accreta, Cardiac ECHOCARDIOGRAM - normal Intrapartum Procedures: cesarean: low cervical, transverse and Failed TOLAC for failure to descend Postpartum Procedures: none Complications-Operative and Postpartum: none Hemoglobin  Date Value Range Status  08/13/2011 11.8* 12.0-15.0 (g/dL) Final  1/61/0960 45.4   Final     HCT  Date Value Range Status  08/13/2011 35.8* 36.0-46.0 (%) Final  04/06/2011 35   Final   Brief Hospital Course: Pt presented to the MAU in labor.  She was admitted to L&D with expectant management and plans for TOLAC.  Pt progressed to fully dilated and +2 station and ultimately requested repeat C/S after 3 hours of pushing.  Pt underwent uneventful C/S and was transferred to mother-baby unit following recovery in PACU.  The remainder of her hospital course was uncomplicated and pts pain was well controlled.    Physical Exam:  General: alert, cooperative and no distress Lochia: appropriate Uterine Fundus: firm Incision: healing well, no significant drainage, no dehiscence, no significant erythema, staples in place DVT Evaluation: No evidence of DVT seen on physical exam. Negative Homan's sign. No cords or calf tenderness. No significant calf/ankle edema.  Discharge Diagnoses: Term Pregnancy-delivered  Discharge Information: Date: 08/14/2011 Activity: pelvic rest Diet: routine Medications: Ibuprofen and Percocet Condition: stable Instructions: refer to practice specific booklet Discharge to: home Follow-up Information    Follow up with baby love nurse. (in 1-2 days)       Follow up with [facility] in 6 weeks.         Newborn Data: Live born female  Birth Weight: 8 lb 15 oz (4055 g) APGAR: 7, 8  Home with mother.  Andrena Mews, DO [facility] Resident - PGY-1 08/14/2011 10:13 AM   Patient seen and  examined.  Agree with above note.  Levie Heritage, DO 08/14/2011 10:54 AM

## 2011-08-14 NOTE — Progress Notes (Signed)
Pt discharged with staples.  MD plans for [employer] RN to remove staples in pt's home on Monday, June 10.  Left Voice Mail on Jayme Cloud phone [phone]) at [facility] with MD's request.  Pt stated that she has [employer] brochure and that she will call program office if she has not heard from nurse by 12 noon on Monday.

## 2011-08-14 NOTE — Plan of Care (Signed)
Problem: Discharge Progression Outcomes Goal: Remove staples per MD order Outcome: Not Met (add Reason) Will be removed by [employer] nurse on Monday

## 2011-08-18 ENCOUNTER — Encounter: Payer: Self-pay | Admitting: Family

## 2011-09-16 ENCOUNTER — Ambulatory Visit: Payer: MEDICAID | Admitting: Family

## 2011-10-02 ENCOUNTER — Ambulatory Visit (INDEPENDENT_AMBULATORY_CARE_PROVIDER_SITE_OTHER): Payer: Medicaid Other | Admitting: Obstetrics & Gynecology

## 2011-10-02 ENCOUNTER — Encounter: Payer: Self-pay | Admitting: Obstetrics & Gynecology

## 2011-10-02 VITALS — BP 105/68 | HR 81 | Temp 97.0°F | Resp 12 | Ht 66.0 in | Wt 156.6 lb

## 2011-10-02 DIAGNOSIS — O99345 Other mental disorders complicating the puerperium: Secondary | ICD-10-CM

## 2011-10-02 MED ORDER — ESTRADIOL 0.1 MG/GM VA CREA: 2.0000 g | TOPICAL_CREAM | Freq: Every day | VAGINAL | Status: AC

## 2011-10-02 MED ORDER — ESTRADIOL 0.1 MG/GM VA CREA: 2.0000 g | TOPICAL_CREAM | Freq: Every day | VAGINAL | Status: DC

## 2011-10-02 NOTE — Progress Notes (Signed)
  Subjective:     [name] [name] is a 21 y.o. female who presents for a postpartum visit. She is 6 weeks postpartum following a low cervical transverse Cesarean section. I have fully reviewed the prenatal and intrapartum course.Marland Kitchen Postpartum course has been uncomplicated. Baby's course has been unremarkable. Bleeding thick, heavy lochia. Bowel function is normal. Bladder function is normal. Patient is sexually active. Contraception method is plans Nexplanon. Postpartum depression screening: negative.  The following portions of the patient's history were reviewed and updated as appropriate: past family history and past surgical history.  Review of Systems [place]   Objective:    BP 105/68  Pulse 81  Temp 97 F (36.1 C) (Oral)  Resp 12  Ht 5\' 6"  (1.676 m)  Wt 156 lb 9.6 oz (71.033 kg)  BMI 25.28 kg/m2  LMP 10/02/2011  Breastfeeding? No  General:  alert and no distress   Breasts:  negative  Lungs: clear to auscultation bilaterally  Heart:  regular rate and rhythm, S1, S2 normal, no murmur, click, rub or gallop  Abdomen: soft, non-tender; bowel sounds normal; no masses,  no organomegaly   Vulva:  normal  Vagina: blood in vault  Cervix:  no cervical motion tenderness  Corpus: normal  Adnexa:  not evaluated  Rectal Exam: Not performed.        Assessment:    6 week postpartum exam.  Plan:    1. Contraception: to be scheduled for Nexplanon 2.  Follow up in: 1 week or as needed.  Subjective:     [name] [name] is a 21 y.o. female who presents for a postpartum visit. She is 6 week postpartum following a low cervical transverse Cesarean section. I have fully reviewed the prenatal and intrapartum course. Postpartum course has been uncomplicated. Baby's course has been good. Baby is feeding by bottle. Bleeding thick, heavy lochia. Bowel function is normal. Bladder function is normal. Patient is sexually active. Contraception method is none and plqns Nexplanon. Postpartum depression  screening: negative.  The following portions of the patient's history were reviewed and updated as appropriate: allergies, past family history and past surgical history.  Review of Systems Pertinent items are noted in HPI.   Objective:    BP 105/68  Pulse 81  Temp 97 F (36.1 C) (Oral)  Resp 12  Ht 5\' 6"  (1.676 m)  Wt 156 lb 9.6 oz (71.033 kg)  BMI 25.28 kg/m2  LMP 10/02/2011  Breastfeeding? No  General:  alert, cooperative, appears stated age and no distress   Breasts:  negative  Lungs: clear to auscultation bilaterally  Heart:  regular rate and rhythm, S1, S2 normal, no murmur, click, rub or gallop  Abdomen: soft, non-tender; bowel sounds normal; no masses,  no organomegaly   Vulva:  normal  Vagina: normal vagina  Cervix:  no cervical motion tenderness  Corpus: normal  Adnexa:  no mass, fullness, tenderness  Rectal Exam: Not performed.        Assessment:    6 week PP check postpartum exam. Pap smear not done at today's visit.   Plan:    1. Contraception: Nexplanon 2. To f/u fo rNexplanon 3. Follow up in: 1 week or as needed.  Subjective:

## 2011-10-02 NOTE — Patient Instructions (Signed)

## 2011-10-02 NOTE — Addendum Note (Signed)
Addended by: Sherre Lain A on: 10/02/2011 12:06 PM   Modules accepted: Orders

## 2011-10-29 ENCOUNTER — Ambulatory Visit: Payer: Medicaid Other | Admitting: Obstetrics and Gynecology

## 2011-11-18 ENCOUNTER — Ambulatory Visit (INDEPENDENT_AMBULATORY_CARE_PROVIDER_SITE_OTHER): Payer: Medicaid Other | Admitting: Obstetrics & Gynecology

## 2011-11-18 ENCOUNTER — Encounter: Payer: Self-pay | Admitting: Obstetrics & Gynecology

## 2011-11-18 VITALS — BP 130/73 | HR 78 | Temp 97.1°F | Resp 20 | Ht 66.0 in | Wt 159.7 lb

## 2011-11-18 DIAGNOSIS — Z01812 Encounter for preprocedural laboratory examination: Secondary | ICD-10-CM

## 2011-11-18 DIAGNOSIS — IMO0001 Reserved for inherently not codable concepts without codable children: Secondary | ICD-10-CM

## 2011-11-18 DIAGNOSIS — Z30017 Encounter for initial prescription of implantable subdermal contraceptive: Secondary | ICD-10-CM

## 2011-11-18 DIAGNOSIS — Z3009 Encounter for other general counseling and advice on contraception: Secondary | ICD-10-CM

## 2011-11-18 LAB — POCT PREGNANCY, URINE: Preg Test, Ur: NEGATIVE

## 2011-11-18 MED ORDER — ETONOGESTREL 68 MG ~~LOC~~ IMPL: 1.0000 | DRUG_IMPLANT | Freq: Once | SUBCUTANEOUS | Status: DC

## 2011-11-18 NOTE — Addendum Note (Signed)
Addended by: Toula Moos on: 11/18/2011 02:15 PM   Modules accepted: Orders

## 2011-11-18 NOTE — Patient Instructions (Signed)
Contraceptive Implant Information A contraceptive implant is a plastic rod that is inserted under the skin. It is usually inserted under the skin of your upper arm. It continually releases small amounts of progestin (synthetic progesterone) into the bloodstream. This prevents an egg from being released from the ovary. It also thickens the cervical mucus to prevent sperm from entering the cervix, and it thins the uterine lining to prevent a fertilized egg from attaching to the uterus. They can be effective for up to 3 years. Implants do not provide protection against sexually transmitted diseases (STDs).  The procedure to insert an implant usually takes about 10 minutes. There may be minor bruising, swelling, and discomfort at the insertion site for a couple days. The implant begins to work within the first day. Other contraceptive protection should be used for 2 weeks. Follow up with your caregiver to get rechecked as directed. Your caregiver will make sure you are a good candidate for the contraceptive implant. Discuss with your caregiver the possible side effects of the implant ADVANTAGES  It prevents pregnancy for up to 3 years.   It is easily reversible.   It is convenient.   The progestins may protect against uterine and ovarian cancer.   It can be used when breastfeeding.   It can be used by women who cannot take estrogen.  DISADVANTAGES  You may have irregular or unplanned vaginal bleeding.   You may develop side effects, including headache, weight gain, acne, breast tenderness, or mood changes.   You may have tissue or nerve damage after insertion (rare).   It may be difficult and uncomfortable to remove.   Certain medications may interfere with the effectiveness of the implants.  REMOVAL OF IMPLANT The implant should be removed in 3 years or as directed by your caregiver. The implants effect wears off in a few hours after removal. Your ability to get pregnant (fertility) is  restored within a couple of weeks. New implants can be inserted as soon as the old ones are removed if desired. DO NOT GET THE IMPLANT IF:   You are pregnant.   You have a history of breast cancer, osteoporosis, blood clots, heart disease, diabetes, high blood pressure, liver disease, tumors, or stroke.    You have undiagnosed vaginal bleeding.   You have overly sensitive to certain parts of the implant.  Document Released: 02/12/2011 Document Reviewed: 02/10/2011 ExitCare Patient Information 2012 [employer]. 

## 2011-11-18 NOTE — Progress Notes (Signed)
  Patient given informed consent, she signed consent form. Pregnancy test was negative.  Appropriate time out taken.  Patient's left arm was prepped and draped in the usual sterile fashion.. The ruler used to measure and mark insertion area.  Patient was prepped with alcohol swab and then injected with 5 ml of 1 % lidocaine.  She was prepped with betadine, Nexplanon removed from packaging,  Device confirmed in needle, then inserted full length of needle and withdrawn per handbook instructions.  There was minimal blood loss.  Patient insertion site covered with guaze and a pressure bandage to reduce any bruising.  The patient tolerated the procedure well and was given post procedure instructions. Return in about three months for Nexplanon check.

## 2012-07-20 ENCOUNTER — Ambulatory Visit (INDEPENDENT_AMBULATORY_CARE_PROVIDER_SITE_OTHER): Payer: Self-pay | Admitting: Obstetrics & Gynecology

## 2012-07-20 ENCOUNTER — Encounter: Payer: Self-pay | Admitting: Obstetrics & Gynecology

## 2012-07-20 VITALS — BP 144/88 | HR 99 | Temp 98.1°F | Ht 66.0 in | Wt 194.0 lb

## 2012-07-20 DIAGNOSIS — Z3046 Encounter for surveillance of implantable subdermal contraceptive: Secondary | ICD-10-CM

## 2012-07-20 DIAGNOSIS — Z3009 Encounter for other general counseling and advice on contraception: Secondary | ICD-10-CM

## 2012-07-20 NOTE — Progress Notes (Signed)
Patient ID: [name] [name], female   DOB: 04/17/1990, 22 y.o.   MRN: [id number] Patient given informed consent, she signed consent form. She has gained >40# since her Nexplanon was placed.  She wants it removed.  Appropriate time out taken.  Patient's left arm was prepped and draped in the usual sterile fashion. Patient was prepped with alcohol swab and then injected with 3 ml of 1 % lidocaine.  She was prepped with betadine.  A #15 blade was used to make a small incision over the Nexplanon rod.  Using curved forceps, the end of the rod was grasped and the scar tissue was removed and the device was removed intact.  There was minimal blood loss. The incision site was covered with guaze and a pressure bandage to reduce any bruising.  The patient tolerated the procedure well and was given post procedure instructions. Other contraception options reviewed with patient.  She was given an info sheet on a Paraguard IUD and will f/u when she decides what she will ultimately use.  She is currently practicing abstinence.    Seona Clemenson L. Harraway-Smith, M.D., [place]

## 2012-07-20 NOTE — Patient Instructions (Addendum)
Intrauterine Device Information  An intrauterine device (IUD) is inserted into your uterus and prevents pregnancy. There are 2 types of IUDs available:  · Copper IUD. This type of IUD is wrapped in copper wire and is placed inside the uterus. Copper makes the uterus and fallopian tubes produce a fluid that kills sperm. The copper IUD can stay in place for 10 years.  · Hormone IUD. This type of IUD contains the hormone progestin (synthetic progesterone). The hormone thickens the cervical mucus and prevents sperm from entering the uterus, and it also thins the uterine lining to prevent implantation of a fertilized egg. The hormone can weaken or kill the sperm that get into the uterus. The hormone IUD can stay in place for 5 years.  Your caregiver will make sure you are a good candidate for a contraceptive IUD. Discuss with your caregiver the possible side effects.  ADVANTAGES  · It is highly effective, reversible, long-acting, and low maintenance.  · There are no estrogen-related side effects.  · An IUD can be used when breastfeeding.  · It is not associated with weight gain.  · It works immediately after insertion.  · The copper IUD does not interfere with your female hormones.  · The progesterone IUD can make heavy menstrual periods lighter.  · The progesterone IUD can be used for 5 years.  · The copper IUD can be used for 10 years.  DISADVANTAGES  · The progesterone IUD can be associated with irregular bleeding patterns.  · The copper IUD can make your menstrual flow heavier and more painful.  · You may experience cramping and vaginal bleeding after insertion.  Document Released: 01/28/2004 Document Revised: 05/18/2011 Document Reviewed: 06/28/2010  ExitCare® Patient Information ©2013 [employer].

## 2012-07-25 ENCOUNTER — Encounter: Payer: Self-pay | Admitting: *Deleted

## 2012-08-20 IMAGING — US US OB COMP +14 WK
1 of 2 series · 12 of 28 positions shown · non-contrast
Comparison: none

[Series 1: us ob comp +14 wk · 12 of 46 slices shown]
[im 2/46]
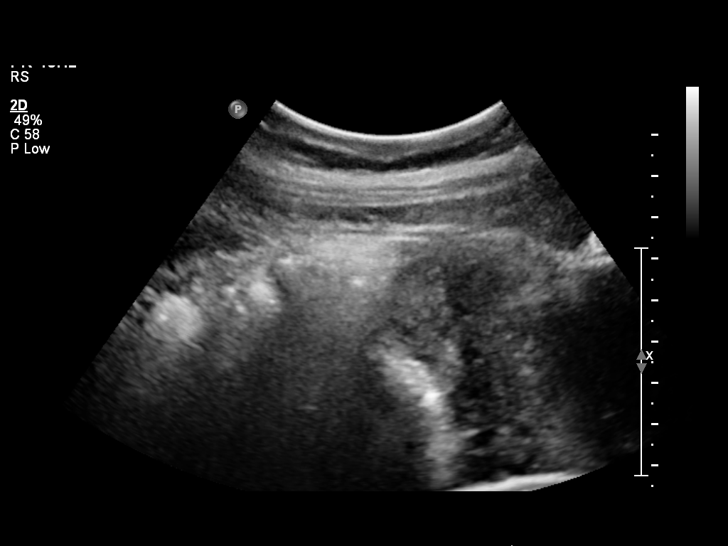
[im 6/46]
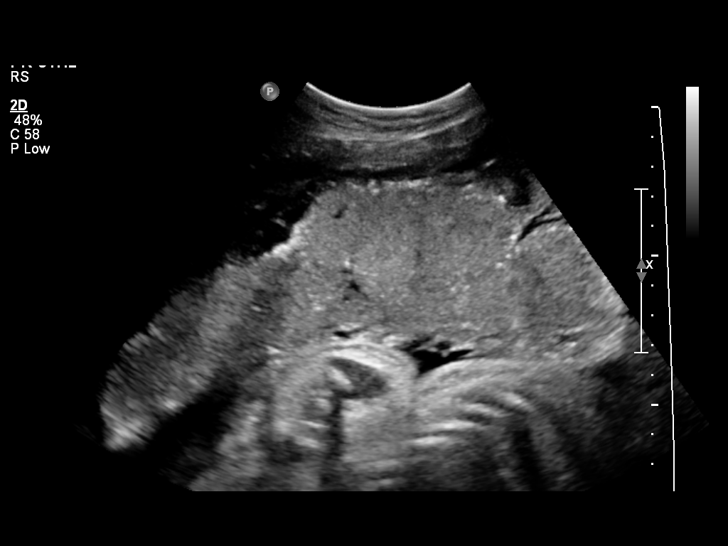
[im 9/46]
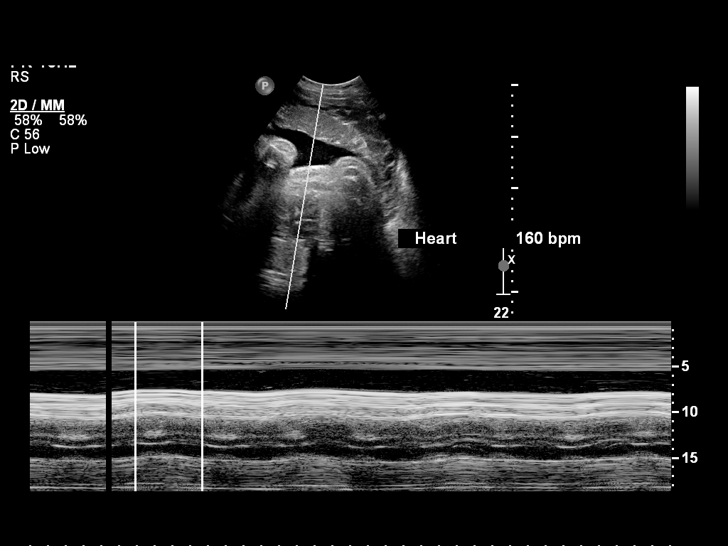
[im 14/46]
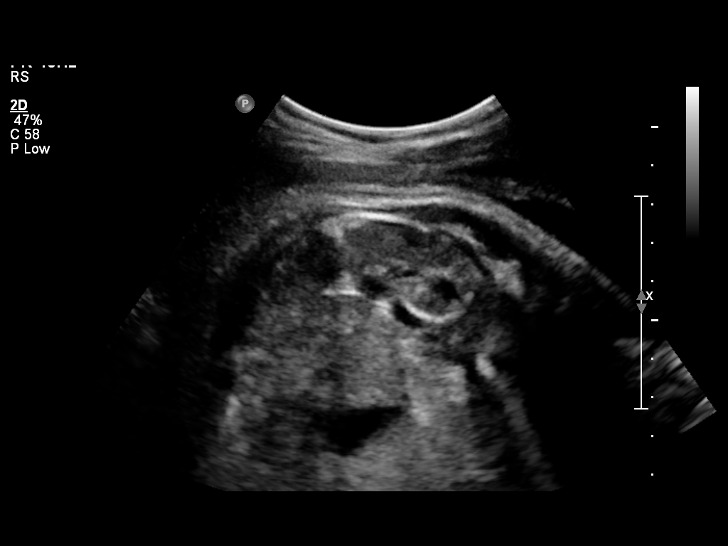
[im 18/46]
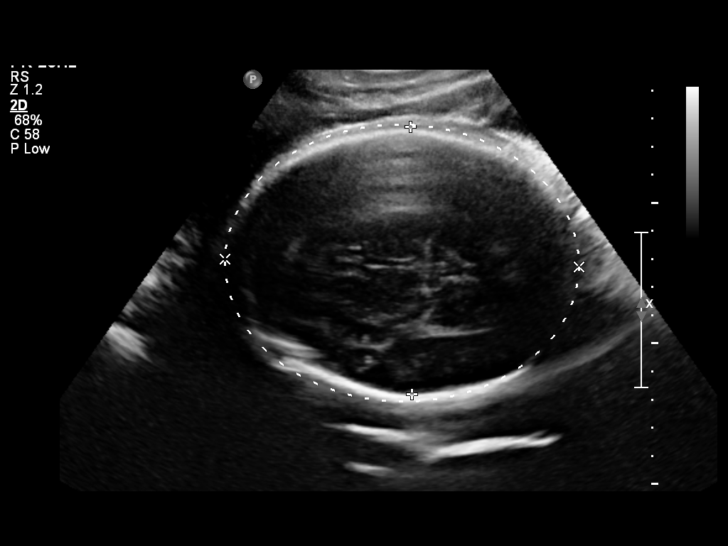
[im 21/46]
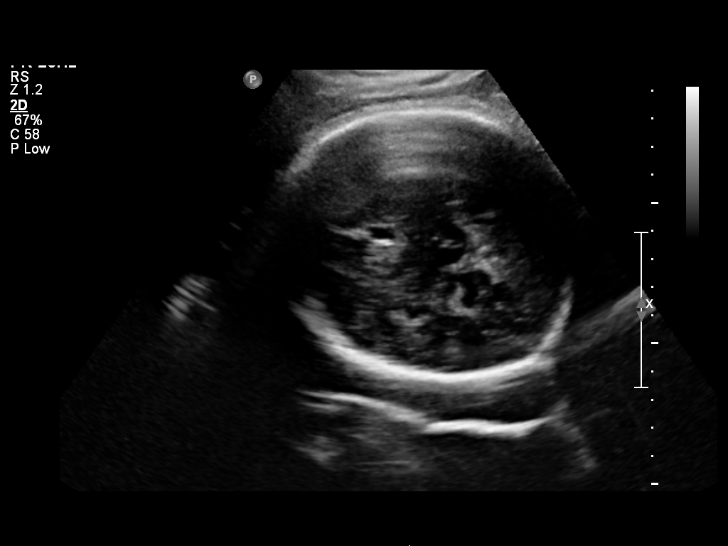
[im 26/46]
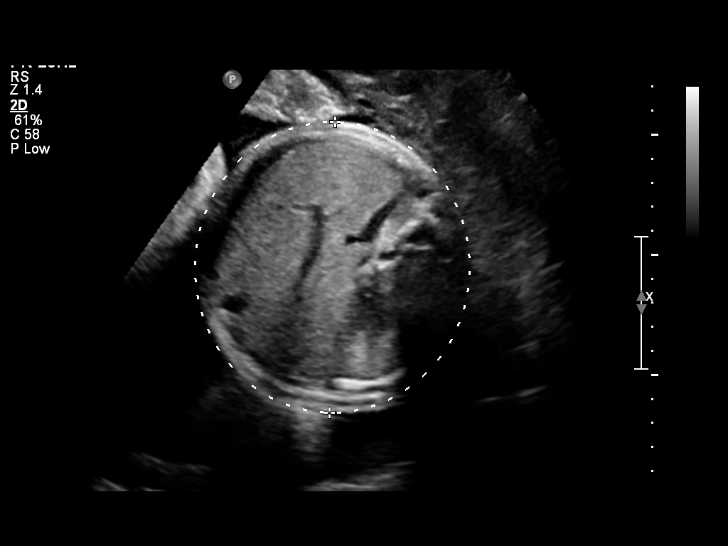
[im 30/46]
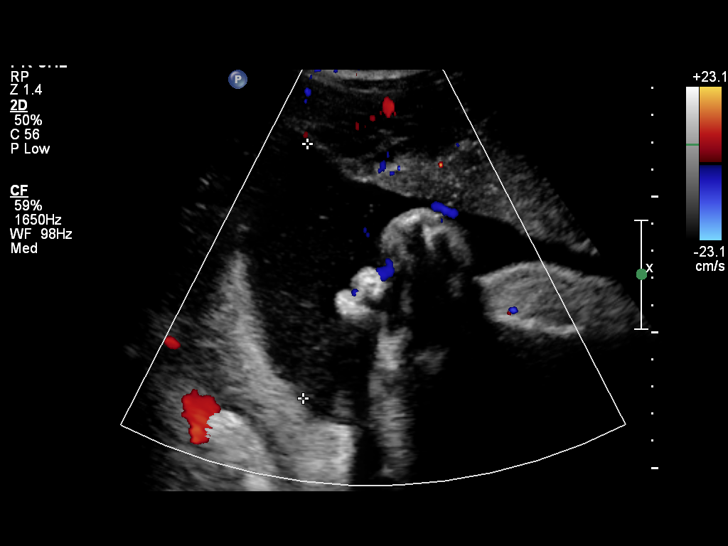
[im 33/46]
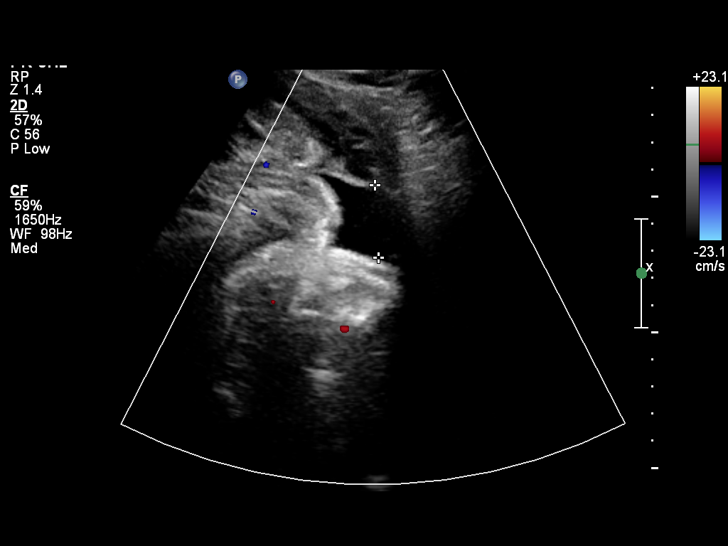
[im 39/46]
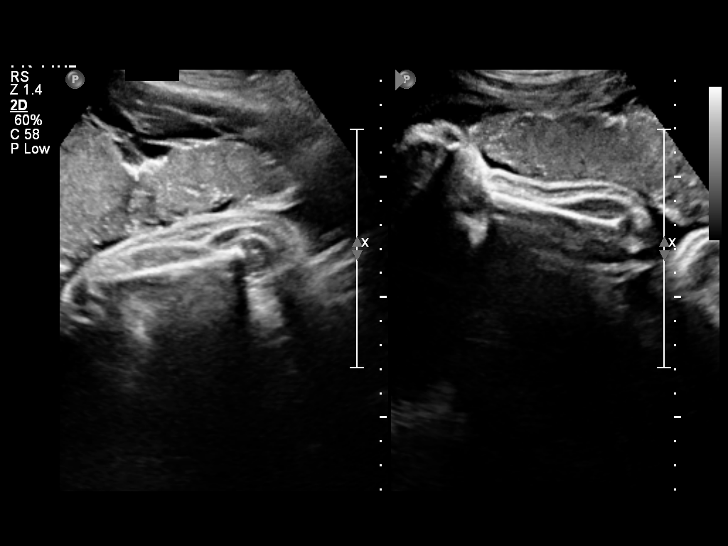
[im 42/46]
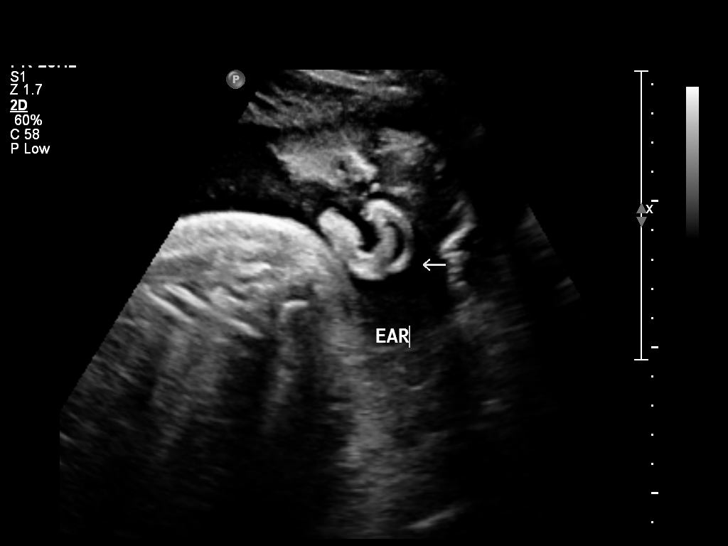
[im 46/46]
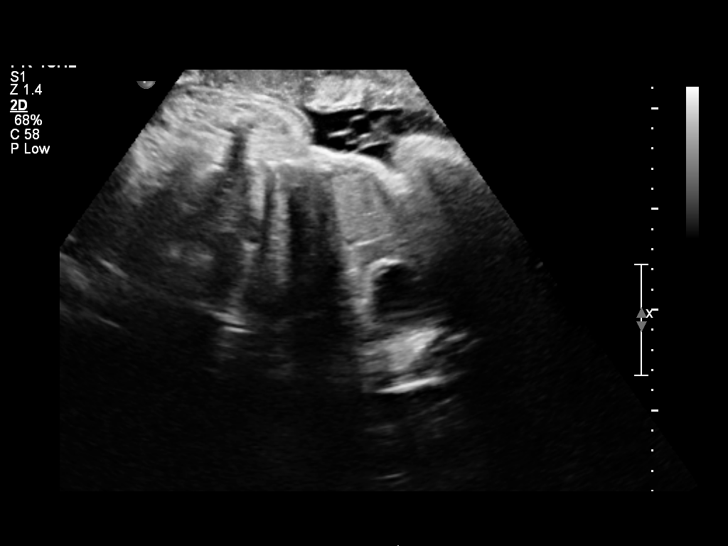

[12 of 28 positions shown; findings below may reference images not displayed]

OBSTETRICS REPORT
                      (Signed Final 07/29/2011 [DATE])

 Order#:         [id number]
Procedures

 US OB COMP + 14 WK                                    76805.1
Indications

 Basic anatomic survey
 Assess Fetal Growth / Estimated Fetal Weight
 Size greater than dates (Large for gestational [AGE]
 Previous cesarean section
Fetal Evaluation

 Fetal Heart Rate:  160                         bpm
 Cardiac Activity:  Observed
 Presentation:      Cephalic
 Placenta:          Anterior, above cervical os
 P. Cord            Visualized
 Insertion:

 Amniotic Fluid
 AFI FV:      Subjectively within normal limits
 AFI Sum:     17.11   cm      66   %Tile     Larg Pckt:   9.34   cm
 RUQ:   9.34   cm    RLQ:    2.58   cm    LUQ:   2.53    cm   LLQ:    2.66   cm
Biometry

 BPD:     94.9  mm    G. Age:   38w 5d                CI:        72.15   70 - 86
                                                      FL/HC:      22.0   20.9 -

 HC:     355.5  mm    G. Age:   41w 5d       97  %    HC/AC:      0.96   0.92 -

 AC:     369.6  mm    G. Age:   40w 6d     > 97  %    FL/BPD:     82.5   71 - 87
 FL:      78.3  mm    G. Age:   40w 0d       93  %    FL/AC:      21.2   20 - 24

 Est. FW:    0770  gm      9 lb 1 oz   > 90  %
Gestational Age

 Clinical EDD:  37w 5d                                        EDD:   08/14/11
 U/S Today:     40w 2d                                        EDD:   07/27/11
 Best:          37w 5d    Det. By:   Clinical EDD             EDD:   08/14/11
Anatomy
 Cranium:           Appears normal      Aortic Arch:       Basic anatomy
                                                           exam per order
 Fetal Cavum:       Appears normal      Ductal Arch:       Basic anatomy
                                                           exam per order
 Ventricles:        Not well            Diaphragm:         Basic anatomy
                    visualized                             exam per order
 Choroid Plexus:    Appears normal      Stomach:           Appears
                                                           normal, left
                                                           sided
 Cerebellum:        Not well            Abdomen:           Appears normal
                    visualized
 Posterior Fossa:   Not well            Abdominal Wall:    Appears nml
                    visualized                             (cord insert,
                                                           abd wall)
 Nuchal Fold:       Not applicable      Cord Vessels:      Appears normal
                    (>20 wks GA)                           (3 vessel cord)
 Face:              Lips appear         Kidneys:           Appear normal
                    normal (basic
                    anatomy exam)
 Heart:             Not well            Bladder:           Appears normal
                    visualized
 RVOT:              Not well            Spine:             Not well
                    visualized                             visualized
 LVOT:              Not well            Limbs:             Four extremities
                    visualized                             seen

 Other:     Fetus appears to be a female. Technically difficult due to
            advanced GA and fetal position. Complete fetal anatomic
            survey previously performed in office.
Cervix Uterus Adnexa

 Cervix:       Not visualized (advanced GA >34 wks)

 Left Ovary:   Not visualized.
 Right Ovary:  Size(cm) L: 3.11 x W: 1.77 x H: 1.6  Volume(cc):
 Adnexa:     No abnormality visualized.
Impression

 Assigned GA is currently 37w 5d by clinical EDD.   Fetus
 measuring  large-for-gestational-age (EFW of 1300gm >90
 %ile).
 Suboptimal visualization of anatomy due to advanced GA,
 however no fetal anomalies identified.
 Amniotic fluid within normal limits, with AFI of 17.11 cm.

## 2014-01-08 ENCOUNTER — Encounter: Payer: Self-pay | Admitting: Obstetrics & Gynecology

## 2014-07-13 ENCOUNTER — Ambulatory Visit (INDEPENDENT_AMBULATORY_CARE_PROVIDER_SITE_OTHER): Payer: 59 | Admitting: Internal Medicine

## 2014-07-13 ENCOUNTER — Other Ambulatory Visit (INDEPENDENT_AMBULATORY_CARE_PROVIDER_SITE_OTHER): Payer: 59

## 2014-07-13 ENCOUNTER — Encounter: Payer: Self-pay | Admitting: Internal Medicine

## 2014-07-13 VITALS — BP 112/60 | HR 87 | Temp 98.8°F | Resp 16 | Ht 66.0 in | Wt 177.4 lb

## 2014-07-13 DIAGNOSIS — Z0000 Encounter for general adult medical examination without abnormal findings: Secondary | ICD-10-CM | POA: Diagnosis not present

## 2014-07-13 LAB — CBC
HCT: 36.5 % (ref 36.0–46.0)
Hemoglobin: 12.5 g/dL (ref 12.0–15.0)
MCHC: 34.2 g/dL (ref 30.0–36.0)
MCV: 86.8 fl (ref 78.0–100.0)
Platelets: 166 10*3/uL (ref 150.0–400.0)
RBC: 4.2 Mil/uL (ref 3.87–5.11)
RDW: 12.3 % (ref 11.5–15.5)
WBC: 5.5 10*3/uL (ref 4.0–10.5)

## 2014-07-13 LAB — COMPREHENSIVE METABOLIC PANEL
ALK PHOS: 48 U/L (ref 39–117)
ALT: 12 U/L (ref 0–35)
AST: 18 U/L (ref 0–37)
Albumin: 3.9 g/dL (ref 3.5–5.2)
BILIRUBIN TOTAL: 0.5 mg/dL (ref 0.2–1.2)
BUN: 8 mg/dL (ref 6–23)
CO2: 29 mEq/L (ref 19–32)
CREATININE: 0.7 mg/dL (ref 0.40–1.20)
Calcium: 9.4 mg/dL (ref 8.4–10.5)
Chloride: 103 mEq/L (ref 96–112)
GFR: 130.01 mL/min (ref >60.00)
Glucose, Bld: 83 mg/dL (ref 70–99)
POTASSIUM: 4 meq/L (ref 3.5–5.1)
SODIUM: 137 meq/L (ref 135–145)
TOTAL PROTEIN: 6.9 g/dL (ref 6.0–8.3)

## 2014-07-13 LAB — LIPID PANEL
CHOL/HDL RATIO: 3
Cholesterol: 150 mg/dL (ref 0–200)
HDL: 49.6 mg/dL (ref >39.00)
LDL CALC: 77 mg/dL (ref 0–99)
NonHDL: 100.4
Triglycerides: 118 mg/dL (ref 0.0–149.0)
VLDL: 23.6 mg/dL (ref 0.0–40.0)

## 2014-07-13 NOTE — Patient Instructions (Signed)
We will check the blood work today and call you back with the results.   You can come back in about 2 years if you are doing well. If you have any new problems or questions please feel free to call the office.   Health Maintenance Adopting a healthy lifestyle and getting preventive care can go a long way to promote health and wellness. Talk with your health care provider about what schedule of regular examinations is right for you. This is a good chance for you to check in with your provider about disease prevention and staying healthy. In between checkups, there are plenty of things you can do on your own. Experts have done a lot of research about which lifestyle changes and preventive measures are most likely to keep you healthy. Ask your health care provider for more information. WEIGHT AND DIET  Eat a healthy diet  Be sure to include plenty of vegetables, fruits, low-fat dairy products, and lean protein.  Do not eat a lot of foods high in solid fats, added sugars, or salt.  Get regular exercise. This is one of the most important things you can do for your health.  Most adults should exercise for at least 150 minutes each week. The exercise should increase your heart rate and make you sweat (moderate-intensity exercise).  Most adults should also do strengthening exercises at least twice a week. This is in addition to the moderate-intensity exercise.  Maintain a healthy weight  Body mass index (BMI) is a measurement that can be used to identify possible weight problems. It estimates body fat based on height and weight. Your health care provider can help determine your BMI and help you achieve or maintain a healthy weight.  For females 22 years of age and older:   A BMI below 18.5 is considered underweight.  A BMI of 18.5 to 24.9 is normal.  A BMI of 25 to 29.9 is considered overweight.  A BMI of 30 and above is considered obese.  Watch levels of cholesterol and blood  lipids  You should start having your blood tested for lipids and cholesterol at 26 years of age, then have this test every 5 years.  You may need to have your cholesterol levels checked more often if:  Your lipid or cholesterol levels are high.  You are older than 26 years of age.  You are at high risk for heart disease.  CANCER SCREENING   Lung Cancer  Lung cancer screening is recommended for adults 57-16 years old who are at high risk for lung cancer because of a history of smoking.  A yearly low-dose CT scan of the lungs is recommended for people who:  Currently smoke.  Have quit within the past 15 years.  Have at least a 30-pack-year history of smoking. A pack year is smoking an average of one pack of cigarettes a day for 1 year.  Yearly screening should continue until it has been 15 years since you quit.  Yearly screening should stop if you develop a health problem that would prevent you from having lung cancer treatment.  Breast Cancer  Practice breast self-awareness. This means understanding how your breasts normally appear and feel.  It also means doing regular breast self-exams. Let your health care provider know about any changes, no matter how small.  If you are in your 20s or 30s, you should have a clinical breast exam (CBE) by a health care provider every 1-3 years as part of a regular  health exam.  If you are 40 or older, have a CBE every year. Also consider having a breast X-ray (mammogram) every year.  If you have a family history of breast cancer, talk to your health care provider about genetic screening.  If you are at high risk for breast cancer, talk to your health care provider about having an MRI and a mammogram every year.  Breast cancer gene (BRCA) assessment is recommended for women who have family members with BRCA-related cancers. BRCA-related cancers include:  Breast.  Ovarian.  Tubal.  Peritoneal cancers.  Results of the assessment  will determine the need for genetic counseling and BRCA1 and BRCA2 testing. Cervical Cancer Routine pelvic examinations to screen for cervical cancer are no longer recommended for nonpregnant women who are considered low risk for cancer of the pelvic organs (ovaries, uterus, and vagina) and who do not have symptoms. A pelvic examination may be necessary if you have symptoms including those associated with pelvic infections. Ask your health care provider if a screening pelvic exam is right for you.   The Pap test is the screening test for cervical cancer for women who are considered at risk.  If you had a hysterectomy for a problem that was not cancer or a condition that could lead to cancer, then you no longer need Pap tests.  If you are older than 65 years, and you have had normal Pap tests for the past 10 years, you no longer need to have Pap tests.  If you have had past treatment for cervical cancer or a condition that could lead to cancer, you need Pap tests and screening for cancer for at least 20 years after your treatment.  If you no longer get a Pap test, assess your risk factors if they change (such as having a new sexual partner). This can affect whether you should start being screened again.  Some women have medical problems that increase their chance of getting cervical cancer. If this is the case for you, your health care provider may recommend more frequent screening and Pap tests.  The human papillomavirus (HPV) test is another test that may be used for cervical cancer screening. The HPV test looks for the virus that can cause cell changes in the cervix. The cells collected during the Pap test can be tested for HPV.  The HPV test can be used to screen women 61 years of age and older. Getting tested for HPV can extend the interval between normal Pap tests from three to five years.  An HPV test also should be used to screen women of any age who have unclear Pap test results.  After  26 years of age, women should have HPV testing as often as Pap tests.  Colorectal Cancer  This type of cancer can be detected and often prevented.  Routine colorectal cancer screening usually begins at 26 years of age and continues through 26 years of age.  Your health care provider may recommend screening at an earlier age if you have risk factors for colon cancer.  Your health care provider may also recommend using home test kits to check for hidden blood in the stool.  A small camera at the end of a tube can be used to examine your colon directly (sigmoidoscopy or colonoscopy). This is done to check for the earliest forms of colorectal cancer.  Routine screening usually begins at age 44.  Direct examination of the colon should be repeated every 5-10 years through 26 years  of age. However, you may need to be screened more often if early forms of precancerous polyps or small growths are found. Skin Cancer  Check your skin from head to toe regularly.  Tell your health care provider about any new moles or changes in moles, especially if there is a change in a mole's shape or color.  Also tell your health care provider if you have a mole that is larger than the size of a pencil eraser.  Always use sunscreen. Apply sunscreen liberally and repeatedly throughout the day.  Protect yourself by wearing long sleeves, pants, a wide-brimmed hat, and sunglasses whenever you are outside. HEART DISEASE, DIABETES, AND HIGH BLOOD PRESSURE   Have your blood pressure checked at least every 1-2 years. High blood pressure causes heart disease and increases the risk of stroke.  If you are between 32 years and 20 years old, ask your health care provider if you should take aspirin to prevent strokes.  Have regular diabetes screenings. This involves taking a blood sample to check your fasting blood sugar level.  If you are at a normal weight and have a low risk for diabetes, have this test once every  three years after 26 years of age.  If you are overweight and have a high risk for diabetes, consider being tested at a younger age or more often. PREVENTING INFECTION  Hepatitis B  If you have a higher risk for hepatitis B, you should be screened for this virus. You are considered at high risk for hepatitis B if:  You were born in a country where hepatitis B is common. Ask your health care provider which countries are considered high risk.  Your parents were born in a high-risk country, and you have not been immunized against hepatitis B (hepatitis B vaccine).  You have HIV or AIDS.  You use needles to inject street drugs.  You live with someone who has hepatitis B.  You have had sex with someone who has hepatitis B.  You get hemodialysis treatment.  You take certain medicines for conditions, including cancer, organ transplantation, and autoimmune conditions. Hepatitis C  Blood testing is recommended for:  Everyone born from [birth year] through 1967.  Anyone with known risk factors for hepatitis C. Sexually transmitted infections (STIs)  You should be screened for sexually transmitted infections (STIs) including gonorrhea and chlamydia if:  You are sexually active and are younger than 26 years of age.  You are older than 26 years of age and your health care provider tells you that you are at risk for this type of infection.  Your sexual activity has changed since you were last screened and you are at an increased risk for chlamydia or gonorrhea. Ask your health care provider if you are at risk.  If you do not have HIV, but are at risk, it may be recommended that you take a prescription medicine daily to prevent HIV infection. This is called pre-exposure prophylaxis (PrEP). You are considered at risk if:  You are sexually active and do not regularly use condoms or know the HIV status of your partner(s).  You take drugs by injection.  You are sexually active with a partner who  has HIV. Talk with your health care provider about whether you are at high risk of being infected with HIV. If you choose to begin PrEP, you should first be tested for HIV. You should then be tested every 3 months for as long as you are taking PrEP.  PREGNANCY  If you are premenopausal and you may become pregnant, ask your health care provider about preconception counseling.  If you may become pregnant, take 400 to 800 micrograms (mcg) of folic acid every day.  If you want to prevent pregnancy, talk to your health care provider about birth control (contraception). OSTEOPOROSIS AND MENOPAUSE   Osteoporosis is a disease in which the bones lose minerals and strength with aging. This can result in serious bone fractures. Your risk for osteoporosis can be identified using a bone density scan.  If you are 40 years of age or older, or if you are at risk for osteoporosis and fractures, ask your health care provider if you should be screened.  Ask your health care provider whether you should take a calcium or vitamin D supplement to lower your risk for osteoporosis.  Menopause may have certain physical symptoms and risks.  Hormone replacement therapy may reduce some of these symptoms and risks. Talk to your health care provider about whether hormone replacement therapy is right for you.  HOME CARE INSTRUCTIONS   Schedule regular health, dental, and eye exams.  Stay current with your immunizations.   Do not use any tobacco products including cigarettes, chewing tobacco, or electronic cigarettes.  If you are pregnant, do not drink alcohol.  If you are breastfeeding, limit how much and how often you drink alcohol.  Limit alcohol intake to no more than 1 drink per day for nonpregnant women. One drink equals 12 ounces of beer, 5 ounces of wine, or 1 ounces of hard liquor.  Do not use street drugs.  Do not share needles.  Ask your health care provider for help if you need support or  information about quitting drugs.  Tell your health care provider if you often feel depressed.  Tell your health care provider if you have ever been abused or do not feel safe at home. Document Released: 09/08/2010 Document Revised: 07/10/2013 Document Reviewed: 01/25/2013 [facility] Patient Information 2015 [place], [place]. This information is not intended to replace advice given to you by your health care provider. Make sure you discuss any questions you have with your health care provider.

## 2014-07-13 NOTE — Progress Notes (Signed)
Pre visit review using our clinic review tool, if applicable. No additional management support is needed unless otherwise documented below in the visit note. 

## 2014-07-13 NOTE — Progress Notes (Signed)
   Subjective:    Patient ID: [name] [name], female    DOB: 1990-07-27, 24 y.o.   MRN: [id number]  HPI The patient is a 24 YO female who is coming in new for wellness. She has no significant PMH. No new complaints. Brother with HOCM but she was checked with echo a couple years ago and did not have it.   PMH, [facility], social history reviewed and updated with patient.   Review of Systems  Constitutional: Negative for fever, activity change, appetite change, fatigue and unexpected weight change.  HENT: Negative.   Eyes: Negative.   Respiratory: Negative for cough, chest tightness, shortness of breath and wheezing.   Cardiovascular: Negative for chest pain, palpitations and leg swelling.  Gastrointestinal: Negative for nausea, abdominal pain, diarrhea, constipation and abdominal distention.  Musculoskeletal: Negative.   Skin: Negative.   Neurological: Negative.   Psychiatric/Behavioral: Negative.       Objective:   Physical Exam  Constitutional: She is oriented to person, place, and time. She appears well-developed and well-nourished.  HENT:  Head: Normocephalic and atraumatic.  Eyes: EOM are normal.  Neck: Normal range of motion.  Cardiovascular: Normal rate and regular rhythm.   Pulmonary/Chest: Effort normal and breath sounds normal. No respiratory distress. She has no wheezes. She has no rales.  Abdominal: Soft. Bowel sounds are normal. She exhibits no distension. There is no tenderness. There is no rebound.  Musculoskeletal: She exhibits no edema.  Neurological: She is alert and oriented to person, place, and time. Coordination normal.  Skin: Skin is warm and dry.  Psychiatric: She has a normal mood and affect.   Filed Vitals:   07/13/14 1313  BP: 112/60  Pulse: 87  Temp: 98.8 F (37.1 C)  TempSrc: Oral  Resp: 16  Height: 5\' 6"  (1.676 m)  Weight: 177 lb 6.4 oz (80.468 kg)  SpO2: 94%      Assessment & Plan:

## 2014-07-13 NOTE — Assessment & Plan Note (Addendum)
Per computer she has had abnormal pap smear in the past although she denies. Have advised her to follow with Ob/Gyn for her pap smears. She is on sprintec for birth control right now. Non-smoker and recently tried to change her diet and exercise to be better. Tdap done during last pregnancy and up to date (she will find year at home). Does not take flu shot. HIV screening during last pregnancy.

## 2014-10-08 ENCOUNTER — Encounter: Payer: Self-pay | Admitting: Obstetrics and Gynecology

## 2014-10-08 ENCOUNTER — Ambulatory Visit (INDEPENDENT_AMBULATORY_CARE_PROVIDER_SITE_OTHER): Payer: 59 | Admitting: Obstetrics and Gynecology

## 2014-10-08 VITALS — BP 110/70 | HR 68 | Resp 16 | Ht 65.0 in | Wt 159.0 lb

## 2014-10-08 DIAGNOSIS — Z113 Encounter for screening for infections with a predominantly sexual mode of transmission: Secondary | ICD-10-CM

## 2014-10-08 DIAGNOSIS — Z01419 Encounter for gynecological examination (general) (routine) without abnormal findings: Secondary | ICD-10-CM

## 2014-10-08 DIAGNOSIS — Z124 Encounter for screening for malignant neoplasm of cervix: Secondary | ICD-10-CM | POA: Diagnosis not present

## 2014-10-08 MED ORDER — NORGESTIMATE-ETH ESTRADIOL 0.25-35 MG-MCG PO TABS: 1.0000 | ORAL_TABLET | Freq: Every day | ORAL | Status: DC

## 2014-10-08 NOTE — Patient Instructions (Signed)
Check if you have had your TDAP immunization!  EXERCISE AND DIET:  We recommended that you start or continue a regular exercise program for good health. Regular exercise means any activity that makes your heart beat faster and makes you sweat.  We recommend exercising at least 30 minutes per day at least 3 days a week, preferably 4 or 5.  We also recommend a diet low in fat and sugar.  Inactivity, poor dietary choices and obesity can cause diabetes, heart attack, stroke, and kidney damage, among others.    ALCOHOL AND SMOKING:  Women should limit their alcohol intake to no more than 7 drinks/beers/glasses of wine (combined, not each!) per week. Moderation of alcohol intake to this level decreases your risk of breast cancer and liver damage. And of course, no recreational drugs are part of a healthy lifestyle.  And absolutely no smoking or even second hand smoke. Most people know smoking can cause heart and lung diseases, but did you know it also contributes to weakening of your bones? Aging of your skin?  Yellowing of your teeth and nails?  CALCIUM AND VITAMIN D:  Adequate intake of calcium and Vitamin D are recommended.  The recommendations for exact amounts of these supplements seem to change often, but generally speaking 600 mg of calcium (either carbonate or citrate) and 800 units of Vitamin D per day seems prudent. Certain women may benefit from higher intake of Vitamin D.  If you are among these women, your doctor will have told you during your visit.    PAP SMEARS:  Pap smears, to check for cervical cancer or precancers,  have traditionally been done yearly, although recent scientific advances have shown that most women can have pap smears less often.  However, every woman still should have a physical exam from her gynecologist every year. It will include a breast check, inspection of the vulva and vagina to check for abnormal growths or skin changes, a visual exam of the cervix, and then an exam to  evaluate the size and shape of the uterus and ovaries.  And after 26 years of age, a rectal exam is indicated to check for rectal cancers. We will also provide age appropriate advice regarding health maintenance, like when you should have certain vaccines, screening for sexually transmitted diseases, bone density testing, colonoscopy, mammograms, etc.   MAMMOGRAMS:  All women over 59 years old should have a yearly mammogram. Many facilities now offer a "3D" mammogram, which may cost around $50 extra out of pocket. If possible,  we recommend you accept the option to have the 3D mammogram performed.  It both reduces the number of women who will be called back for extra views which then turn out to be normal, and it is better than the routine mammogram at detecting truly abnormal areas.    COLONOSCOPY:  Colonoscopy to screen for colon cancer is recommended for all women at age 61.  We know, you hate the idea of the prep.  We agree, BUT, having colon cancer and not knowing it is worse!!  Colon cancer so often starts as a polyp that can be seen and removed at colonscopy, which can quite literally save your life!  And if your first colonoscopy is normal and you have no family history of colon cancer, most women don't have to have it again for 10 years.  Once every ten years, you can do something that may end up saving your life, right?  We will be happy to  help you get it scheduled when you are ready.  Be sure to check your insurance coverage so you understand how much it will cost.  It may be covered as a preventative service at no cost, but you should check your particular policy.

## 2014-10-08 NOTE — Progress Notes (Signed)
Patient ID: [name] [name], female   DOB: 23-Sep-1990, 24 y.o.   MRN: [id number] 24 y.o. [id number] Single African American female here for annual exam. She would like to get STD testing today. The patient is on OCP's, cycles q month x 4 days. Saturates a super tampon in 3 hours. No BTB, she has intermittent cramps, tolerable. Sexually active, same partner x 6 years. They had a period of separatiion and had other partners at that time. No dyspareunia.  PCP:  Dr. Vertell Novak  Patient's last menstrual period was 10/04/2014.          Sexually active: Yes.    The current method of family planning is OCP (estrogen/progesterone).    Exercising: Yes.    running// home exercises  Smoker:  no  Health Maintenance: Pap:  2014 WNL  History of abnormal Pap:  Only for trichomonas MMG:  N/A Colonoscopy:  N/A BMD:   N/A TDaP:  unsure Screening Labs: PCP   reports that she has never smoked. She has never used smokeless tobacco. She reports that she drinks about 0.6 oz of alcohol per week. She reports that she does not use illicit drugs.  Past Medical History  Diagnosis Date  . Abnormal Pap smear 2012  . Genital warts     Past Surgical History  Procedure Laterality Date  . Cesarean section  2009  . Dilation and curettage of uterus  2007  . Cesarean section  08/12/2011    Procedure: CESAREAN SECTION;  Surgeon: Mora Bellman, MD;  Location: [facility];  Service: Gynecology;  Laterality: N/A;    Current Outpatient Prescriptions  Medication Sig Dispense Refill  . norgestimate-ethinyl estradiol (ORTHO-CYCLEN,SPRINTEC,PREVIFEM) 0.25-35 MG-MCG tablet Take 1 tablet by mouth daily.     No current facility-administered medications for this visit.    Family History  Problem Relation Age of Onset  . Heart disease Brother     HOCM dx at 49 yo  . Parkinsonism Brother   . Heart disease Mother    Brother with hypertrophic cardiomyopathy, WPW  She has 2 children, 3 and 7 (girl and boy).   ROS:   Pertinent items are noted in HPI.  Otherwise, a comprehensive ROS was negative.  Exam:   BP 110/70 mmHg  Pulse 68  Resp 16  Ht 5\' 5"  (1.651 m)  Wt 159 lb (72.122 kg)  BMI 26.46 kg/m2  LMP 10/04/2014    General appearance: alert, cooperative and appears stated age Head: Normocephalic, without obvious abnormality, atraumatic Neck: no adenopathy, supple, symmetrical, trachea midline and thyroid normal to inspection and palpation Lungs: clear to auscultation bilaterally Breasts: normal appearance, no masses or tenderness Heart: regular rate and rhythm Abdomen: soft, non-tender; bowel sounds normal; no masses,  no organomegaly Extremities: extremities normal, atraumatic, no cyanosis or edema Skin: Skin color, texture, turgor normal. No rashes or lesions Lymph nodes: Cervical, supraclavicular, and axillary nodes normal. No abnormal inguinal nodes palpated Neurologic: Grossly normal  Pelvic: External genitalia:  no lesions              Urethra:  normal appearing urethra with no masses, tenderness or lesions              Bartholins and Skenes: normal                 Vagina: normal appearing vagina with normal color and discharge, no lesions              Cervix: no lesions  Pap taken: Yes.   Bimanual Exam:  Uterus:  normal size, contour, position, consistency, mobility, non-tender, anteverted and anteflexed              Adnexa: normal adnexa and no mass, fullness, tenderness              Rectovaginal: Yes.  .  Confirms.              Anus:  normal sphincter tone, no lesions  Chaperone was present for exam.  Assessment:   Well woman visit with normal exam. STD screening Contraception   Plan: Pap with reflex HPV STD testing Continue OCP's  Discussed breast self exams She will check on her TDAP status  After visit summary provided.

## 2014-10-09 LAB — HEPATITIS C ANTIBODY: HCV AB: NEGATIVE

## 2014-10-09 LAB — WET PREP BY MOLECULAR PROBE
Candida species: NEGATIVE
Gardnerella vaginalis: NEGATIVE
TRICHOMONAS VAG: NEGATIVE

## 2014-10-09 LAB — STD PANEL
HIV 1&2 Ab, 4th Generation: NONREACTIVE
Hepatitis B Surface Ag: NEGATIVE

## 2014-10-10 LAB — IPS N GONORRHOEA AND CHLAMYDIA BY PCR

## 2014-10-10 LAB — IPS PAP TEST WITH REFLEX TO HPV

## 2014-10-11 ENCOUNTER — Telehealth: Payer: Self-pay

## 2014-10-11 NOTE — Telephone Encounter (Signed)
-----   Message from Salvadore Dom, MD sent at 10/10/2014  2:29 PM EDT ----- Please inform the patient that her GC/chlamydia were negative. Her pap didn't have enough cells, she will need a repeat pap in 2 months.

## 2014-10-11 NOTE — Telephone Encounter (Signed)
Spoke with patient. Advised of results as seen below from [place]. Patient is agreeable and verbalizes understanding. Repeat pap appointment scheduled for 12/12/2014 at 10am with Dr.Jertson. Patient is agreeable to date and time.  Routing to provider for final review. Patient agreeable to disposition. Will close encounter.   Patient aware provider will review message and nurse will return call if any additional advice or change of disposition.

## 2014-10-11 NOTE — Telephone Encounter (Signed)
Left message to call Deagan Sevin at [phone]. 

## 2014-12-12 ENCOUNTER — Ambulatory Visit: Payer: 59 | Admitting: Obstetrics and Gynecology

## 2014-12-12 ENCOUNTER — Telehealth: Payer: Self-pay | Admitting: Obstetrics and Gynecology

## 2014-12-12 ENCOUNTER — Encounter: Payer: Self-pay | Admitting: Obstetrics and Gynecology

## 2014-12-12 NOTE — Telephone Encounter (Signed)
Patient dnka repeat pap appointment today at 10:00. I was unable to reach patient by phone, letter mailed.

## 2015-08-21 ENCOUNTER — Encounter: Payer: Self-pay | Admitting: Obstetrics and Gynecology

## 2015-09-17 ENCOUNTER — Other Ambulatory Visit: Payer: Self-pay | Admitting: Obstetrics and Gynecology

## 2015-12-23 ENCOUNTER — Encounter: Payer: Self-pay | Admitting: Obstetrics and Gynecology

## 2016-12-17 ENCOUNTER — Ambulatory Visit (INDEPENDENT_AMBULATORY_CARE_PROVIDER_SITE_OTHER): Payer: 59 | Admitting: Nurse Practitioner

## 2016-12-17 ENCOUNTER — Other Ambulatory Visit (INDEPENDENT_AMBULATORY_CARE_PROVIDER_SITE_OTHER): Payer: 59

## 2016-12-17 ENCOUNTER — Encounter: Payer: Self-pay | Admitting: Nurse Practitioner

## 2016-12-17 ENCOUNTER — Telehealth: Payer: Self-pay | Admitting: Nurse Practitioner

## 2016-12-17 VITALS — BP 118/78 | HR 87 | Temp 98.2°F | Ht 65.0 in | Wt 167.0 lb

## 2016-12-17 DIAGNOSIS — R002 Palpitations: Secondary | ICD-10-CM | POA: Diagnosis not present

## 2016-12-17 DIAGNOSIS — Z8249 Family history of ischemic heart disease and other diseases of the circulatory system: Secondary | ICD-10-CM

## 2016-12-17 DIAGNOSIS — R079 Chest pain, unspecified: Secondary | ICD-10-CM | POA: Diagnosis not present

## 2016-12-17 LAB — COMPREHENSIVE METABOLIC PANEL
ALK PHOS: 40 U/L (ref 39–117)
ALT: 7 U/L (ref 0–35)
AST: 17 U/L (ref 0–37)
Albumin: 4.4 g/dL (ref 3.5–5.2)
BILIRUBIN TOTAL: 0.8 mg/dL (ref 0.2–1.2)
BUN: 11 mg/dL (ref 6–23)
CO2: 27 meq/L (ref 19–32)
CREATININE: 0.76 mg/dL (ref 0.40–1.20)
Calcium: 9.2 mg/dL (ref 8.4–10.5)
Chloride: 102 mEq/L (ref 96–112)
GFR: 116.12 mL/min (ref >60.00)
GLUCOSE: 99 mg/dL (ref 70–99)
Potassium: 4.1 mEq/L (ref 3.5–5.1)
SODIUM: 137 meq/L (ref 135–145)
TOTAL PROTEIN: 7.4 g/dL (ref 6.0–8.3)

## 2016-12-17 LAB — CBC
HEMATOCRIT: 39.3 % (ref 36.0–46.0)
Hemoglobin: 13.5 g/dL (ref 12.0–15.0)
MCHC: 34.2 g/dL (ref 30.0–36.0)
MCV: 91.4 fl (ref 78.0–100.0)
Platelets: 191 10*3/uL (ref 150.0–400.0)
RBC: 4.31 Mil/uL (ref 3.87–5.11)
RDW: 12 % (ref 11.5–15.5)
WBC: 5.5 10*3/uL (ref 4.0–10.5)

## 2016-12-17 LAB — TROPONIN I: TNIDX: 0.01 ug/L (ref 0.00–0.06)

## 2016-12-17 LAB — TSH: TSH: 1.04 u[IU]/mL (ref 0.35–4.50)

## 2016-12-17 NOTE — Progress Notes (Addendum)
Subjective:  Patient ID: [name] [name], female    DOB: [birth year]  Age: 26 y.o. MRN: [id number]  CC: Tachycardia (fast HR,right side shoulder,neck and arm pain then numbness. going on since yesterday. SOB. family hx of heart problem)   Palpitations   This is a new problem. The current episode started yesterday. The problem occurs constantly. The problem has been resolved. Nothing aggravates the symptoms. Associated symptoms include chest pain, an irregular heartbeat, numbness and shortness of breath. Pertinent negatives include no coughing, dizziness, nausea, syncope or weakness. She has tried nothing for the symptoms. Risk factors include family history. There is no history of anemia, anxiety, drug use, heart disease, hyperthyroidism or a valve disorder.   Palpitation: Associated with chest pain (right ride) and right arm numbness. FH of WPW and hypertrophic cardiomyopathy (brother). Had echocardiogram done in past (about 48yrs ago) while in [place] (normal per patient.) No caffeine or ETOH or illicit drug use.  Outpatient Medications Prior to Visit  Medication Sig Dispense Refill  . norgestimate-ethinyl estradiol (ORTHO-CYCLEN,SPRINTEC,PREVIFEM) 0.25-35 MG-MCG tablet Take 1 tablet by mouth daily. 3 Package 3   No facility-administered medications prior to visit.     ROS See HPI  Objective:  BP 118/78   Pulse 87   Temp 98.2 F (36.8 C)   Ht 5\' 5"  (1.651 m)   Wt 167 lb (75.8 kg)   SpO2 98%   BMI 27.79 kg/m   BP Readings from Last 3 Encounters:  12/17/16 118/78  10/08/14 110/70  07/13/14 112/60    Wt Readings from Last 3 Encounters:  12/17/16 167 lb (75.8 kg)  10/08/14 159 lb (72.1 kg)  07/13/14 177 lb 6.4 oz (80.5 kg)    Physical Exam  Constitutional: She is oriented to person, place, and time. No distress.  Neck: No thyromegaly present.  Cardiovascular: Normal rate and regular rhythm.   Pulmonary/Chest: Effort normal and breath sounds normal.  Musculoskeletal:  Normal range of motion. She exhibits no edema or tenderness.  Neurological: She is alert and oriented to person, place, and time. No cranial nerve deficit. Coordination normal.  Skin: Skin is warm and dry. No erythema.  Psychiatric: She has a normal mood and affect. Her behavior is normal.  Vitals reviewed.   Lab Results  Component Value Date   WBC 5.5 12/17/2016   HGB 13.5 12/17/2016   HCT 39.3 12/17/2016   PLT 191.0 12/17/2016   GLUCOSE 99 12/17/2016   CHOL 150 07/13/2014   TRIG 118.0 07/13/2014   HDL 49.60 07/13/2014   LDLCALC 77 07/13/2014   ALT 7 12/17/2016   AST 17 12/17/2016   NA 137 12/17/2016   K 4.1 12/17/2016   CL 102 12/17/2016   CREATININE 0.76 12/17/2016   BUN 11 12/17/2016   CO2 27 12/17/2016   TSH 1.04 12/17/2016   ECG: NSR, no ST or T wave abnormality, tall QRS complex  Assessment & Plan:   Susannah was seen today for tachycardia.  Diagnoses and all orders for this visit:  Heart palpitations -     EKG 12-Lead -     Comprehensive metabolic panel; Future -     TSH; Future -     CBC; Future -     ECHOCARDIOGRAM COMPLETE; Future -     Troponin I; Future -     Ambulatory referral to Cardiology  Chest pain, unspecified type -     EKG 12-Lead -     Comprehensive metabolic panel; Future -  TSH; Future -     CBC; Future -     ECHOCARDIOGRAM COMPLETE; Future -     Troponin I; Future -     Ambulatory referral to Cardiology  Family history of Wolff-Parkinson-White (WPW) syndrome -     Ambulatory referral to Cardiology  Family history of hypertrophic cardiomyopathy -     Ambulatory referral to Cardiology   I am having Ms. Bischoff maintain her norgestimate-ethinyl estradiol.  No orders of the defined types were placed in this encounter.   Follow-up: Return if symptoms worsen or fail to improve.  Wilfred Lacy, NP

## 2016-12-17 NOTE — Telephone Encounter (Signed)
Spoke with pt, advise pt that we will call as soon as [place] interpret the lab and she can also check mychart. (lab told her that she would get the result within 35 min).

## 2016-12-17 NOTE — Telephone Encounter (Signed)
Patient requesting call back as soon as possible in regard to labs.

## 2016-12-17 NOTE — Patient Instructions (Addendum)
Normal CBC, CMP and TSH.  negative for troponin elevation.  Go to ED is symptoms worsen.  You will be contacted to schedule echocardiogram.  Palpitations A palpitation is the feeling that your heartbeat is irregular or is faster than normal. It may feel like your heart is fluttering or skipping a beat. Palpitations are usually not a serious problem. They may be caused by many things, including smoking, caffeine, alcohol, stress, and certain medicines. Although most causes of palpitations are not serious, palpitations can be a sign of a serious medical problem. In some cases, you may need further medical evaluation. Follow these instructions at home: Pay attention to any changes in your symptoms. Take these actions to help with your condition:  Avoid the following: ? Caffeinated coffee, tea, soft drinks, diet pills, and energy drinks. ? Chocolate. ? Alcohol.  Do not use any tobacco products, such as cigarettes, chewing tobacco, and e-cigarettes. If you need help quitting, ask your health care provider.  Try to reduce your stress and anxiety. Things that can help you relax include: ? Yoga. ? Meditation. ? Physical activity, such as swimming, jogging, or walking. ? Biofeedback. This is a method that helps you learn to use your mind to control things in your body, such as your heartbeats.  Get plenty of rest and sleep.  Take over-the-counter and prescription medicines only as told by your health care provider.  Keep all follow-up visits as told by your health care provider. This is important.  Contact a health care provider if:  You continue to have a fast or irregular heartbeat after 24 hours.  Your palpitations occur more often. Get help right away if:  You have chest pain or shortness of breath.  You have a severe headache.  You feel dizzy or you faint. This information is not intended to replace advice given to you by your health care provider. Make sure you discuss any  questions you have with your health care provider. Document Released: 02/21/2000 Document Revised: 07/29/2015 Document Reviewed: 11/08/2014 Elsevier Interactive Patient Education  2017 [employer].

## 2016-12-17 NOTE — Addendum Note (Signed)
Addended by: Wilfred Lacy L on: 12/17/2016 04:05 PM   Modules accepted: Orders

## 2016-12-18 ENCOUNTER — Ambulatory Visit (HOSPITAL_COMMUNITY): Payer: 59 | Attending: Internal Medicine

## 2016-12-18 ENCOUNTER — Other Ambulatory Visit: Payer: Self-pay

## 2016-12-18 DIAGNOSIS — R079 Chest pain, unspecified: Secondary | ICD-10-CM

## 2016-12-18 DIAGNOSIS — R002 Palpitations: Secondary | ICD-10-CM | POA: Diagnosis not present

## 2016-12-18 DIAGNOSIS — Z8249 Family history of ischemic heart disease and other diseases of the circulatory system: Secondary | ICD-10-CM | POA: Insufficient documentation

## 2016-12-21 ENCOUNTER — Telehealth: Payer: Self-pay | Admitting: Internal Medicine

## 2016-12-21 NOTE — Telephone Encounter (Signed)
Patient would like to know since her echo came back ok, does she still need to go see a specialist?

## 2016-12-21 NOTE — Telephone Encounter (Signed)
Spoke with pt, yes, pt need to see the cardiologist. Pt verbalized understand.

## 2016-12-21 NOTE — Telephone Encounter (Signed)
error 

## 2016-12-23 ENCOUNTER — Encounter: Payer: Self-pay | Admitting: Cardiology

## 2016-12-23 ENCOUNTER — Ambulatory Visit (INDEPENDENT_AMBULATORY_CARE_PROVIDER_SITE_OTHER): Payer: 59 | Admitting: Cardiology

## 2016-12-23 VITALS — BP 122/70 | HR 81 | Ht 66.0 in | Wt 171.1 lb

## 2016-12-23 DIAGNOSIS — R002 Palpitations: Secondary | ICD-10-CM | POA: Diagnosis not present

## 2016-12-23 DIAGNOSIS — Z8249 Family history of ischemic heart disease and other diseases of the circulatory system: Secondary | ICD-10-CM | POA: Diagnosis not present

## 2016-12-23 NOTE — Patient Instructions (Signed)
Medication Instructions:  Your physician recommends that you continue on your current medications as directed. Please refer to the Current Medication list given to you today.   Labwork: None  Testing/Procedures: Your physician has requested that you have a stress echocardiogram. For further information please visit [email or web address]. Please follow instruction sheet as given.  Your physician has recommended that you wear an event monitor. Event monitors are medical devices that record the heart's electrical activity. Doctors most often [place] these monitors to diagnose arrhythmias. Arrhythmias are problems with the speed or rhythm of the heartbeat. The monitor is a small, portable device. You can wear one while you do your normal daily activities. This is usually used to diagnose what is causing palpitations/syncope (passing out).    Follow-Up: 3 months  Any Other Special Instructions Will Be Listed Below (If Applicable).     If you need a refill on your cardiac medications before your next appointment, please call your pharmacy.   Exercise Stress Echocardiogram An exercise stress echocardiogram is a test that checks how well your heart is working. For this test, you will walk on a treadmill to make your heart beat faster. This test uses sound waves (ultrasound) and a computer to make pictures (images) of your heart. These pictures will be taken before you exercise and after you exercise. What happens before the procedure?  Follow instructions from your doctor about what you cannot eat or drink before the test.  Do not drink or eat anything that has caffeine in it. Stop having caffeine for 24 hours before the test.  Ask your doctor about changing or stopping your normal medicines. This is important if you take diabetes medicines or blood thinners. Ask your doctor if you should take your medicines with water before the test.  If you use an inhaler, bring it to the test.  Do not use  any products that have nicotine or tobacco in them, such as cigarettes and e-cigarettes. Stop using them for 4 hours before the test. If you need help quitting, ask your doctor.  Wear comfortable shoes and clothing. What happens during the procedure?  You will be hooked up to a TV screen. Your doctor will watch the screen to see how fast your heart beats during the test.  Before you exercise, a computer will make a picture of your heart. To do this: ? A gel will be put on your chest. ? A wand will be moved over the gel. ? Sound waves from the wand will go to the computer to make the picture.  Your will start walking on a treadmill. The treadmill will start at a slow speed. It will get faster a little bit at a time. When you walk faster, your heart will beat faster.  The treadmill will be stopped when your heart is working hard.  You will lie down right away so another picture of your heart can be taken.  The test will take 30-60 minutes. What happens after the procedure?  Your heart rate and blood pressure will be watched after the test.  If your doctor says that you can, you may: ? Eat what you usually eat. ? Do your normal activities. ? Take medicines like normal. Summary  An exercise stress echocardiogram is a test that checks how well your heart is working.  Follow instructions about what you cannot eat or drink before the test. Ask your doctor if you should take your normal medicines before the test.  Stop having caffeine for  24 hours before the test. Do not use anything with nicotine or tobacco in it for 4 hours before the test.  A computer will take a picture of your heart before you walk on a treadmill. It will take another picture when you are done walking.  Your heart rate and blood pressure will be watched after the test. This information is not intended to replace advice given to you by your health care provider. Make sure you discuss any questions you have with your  health care provider. Document Released: 12/21/2008 Document Revised: 11/17/2015 Document Reviewed: 11/17/2015 Elsevier Interactive Patient Education  2017 [employer].  Cardiac Event Monitoring A cardiac event monitor is a small recording device that is used to detect abnormal heart rhythms (arrhythmias). The monitor is used to record your heart rhythm when you have symptoms, such as:  Fast heartbeats (palpitations), such as heart racing or fluttering.  Dizziness.  Fainting or light-headedness.  Unexplained weakness.  Some monitors are wired to electrodes placed on your chest. Electrodes are flat, sticky disks that attach to your skin. Other monitors may be hand-held or worn on the wrist. The monitor can be worn for up to 30 days. If the monitor is attached to your chest, a technician will prepare your chest for the electrode placement and show you how to work the monitor. Take time to practice using the monitor before you leave the office. Make sure you understand how to send the information from the monitor to your health care provider. In some cases, you may need to use a landline telephone instead of a cell phone. What are the risks? Generally, this device is safe to use, but it possible that the skin under the electrodes will become irritated. How to use your cardiac event monitor  Wear your monitor at all times, except when you are in water: ? Do not let the monitor get wet. ? Take the monitor off when you bathe. Do not swim or use a hot tub with it on.  Keep your skin clean. Do not put body lotion or moisturizer on your chest.  Change the electrodes as told by your health care provider or any time they stop sticking to your skin. You may need to use medical tape to keep them on.  Try to put the electrodes in slightly different places on your chest to help prevent skin irritation. They must remain in the area under your left breast and in the upper right section of your  chest.  Make sure the monitor is safely clipped to your clothing or in a location close to your body that your health care provider recommends.  Press the button to record as soon as you feel heart-related symptoms, such as: ? Dizziness. ? Weakness. ? Light-headedness. ? Palpitations. ? Thumping or pounding in your chest. ? Shortness of breath. ? Unexplained weakness.  Keep a diary of your activities, such as walking, doing chores, and taking medicine. It is very important to note what you were doing when you pushed the button to record your symptoms. This will help your health care provider determine what might be contributing to your symptoms.  Send the recorded information as recommended by your health care provider. It may take some time for your health care provider to process the results.  Change the batteries as told by your health care provider.  Keep electronic devices away from your monitor. This includes: ? Tablets. ? MP3 players. ? Cell phones.  While wearing your monitor  you should avoid: ? Electric blankets. ? Armed forces operational officer. ? Electric toothbrushes. ? Microwave ovens. ? Magnets. ? Metal detectors. Get help right away if:  You have chest pain.  You have extreme difficulty breathing or shortness of breath.  You develop a very fast heartbeat that persists.  You develop dizziness that does not go away.  You faint or constantly feel like you are about to faint. Summary  A cardiac event monitor is a small recording device that is used to help detect abnormal heart rhythms (arrhythmias).  The monitor is used to record your heart rhythm when you have heart-related symptoms.  Make sure you understand how to send the information from the monitor to your health care provider.  It is important to press the button on the monitor when you have any heart-related symptoms.  Keep a diary of your activities, such as walking, doing chores, and taking medicine. It is  very important to note what you were doing when you pushed the button to record your symptoms. This will help your health care provider learn what might be causing your symptoms. This information is not intended to replace advice given to you by your health care provider. Make sure you discuss any questions you have with your health care provider. Document Released: 12/03/2007 Document Revised: 02/08/2016 Document Reviewed: 02/08/2016 Elsevier Interactive Patient Education  2017 [employer].

## 2016-12-23 NOTE — Addendum Note (Signed)
Addended by: Mattie Marlin on: 12/23/2016 09:33 AM   Modules accepted: Orders

## 2016-12-23 NOTE — Progress Notes (Signed)
Cardiology Office Note:    Date:  12/23/2016   ID:  [name] [name], DOB 03-Sep-1990, MRN [id number]  PCP:  Hoyt Koch, MD  Cardiologist:  Jenean Lindau, MD   Referring MD: Flossie Buffy, NP    ASSESSMENT:    1. Family history of hypertrophic cardiomyopathy   2. Family history of Wolff-Parkinson-White (WPW) syndrome   3. Palpitations    PLAN:    In order of problems listed above:  1. I reviewed the echocardiogram and EKG report and discussed them with her at length. There is no evidence of hypertrophy cardiomyopathy on the Echocardiogram or WPW-like features on the EKG and she is happy about. 2. She complains of chest pain and she is concerned about this and we will obtain a stress echocardiogram. 3. In view of her palpitations I advised her that her TSH is fine. We will get a 2 week event monitor. She will be seen in follow-up appointment in 3 months or earlier if she has any concerns. She knows to go to the nearest emergency room for any significant concerns.    Medication Adjustments/Labs and Tests Ordered: Current medicines are reviewed at length with the patient today.  Concerns regarding medicines are outlined above.  No orders of the defined types were placed in this encounter.  No orders of the defined types were placed in this encounter.    History of Present Illness:    [name] [name] is a 26 y.o. female who is being seen today for the evaluation of palpitations and chest pain at the request of Nche, Charlene Brooke, NP. Patient is a pleasant 26 year old female. She has no significant past medical history. She has family history of hypertrophic cardiomyopathy. Her brother has that and also WPW syndrome. She has undergone echocardiogram in the past and recently which was normal and did not she will any evidence of hypertrophic cardiomyopathy. She mentions to me that she had some chest tightness kind of symptoms and palpitations in the next few  days. She is very concerned about this. No orthopnea or PND.These have happened recurrently in the past several days even after she saw her primary care physician recently. For this reason she is here for evaluation.I noticed her TSH was fine. At the time of my evaluation she is alert awake oriented and in no distress.  Past Medical History:  Diagnosis Date  . Abnormal Pap smear 2012  . Genital warts     Past Surgical History:  Procedure Laterality Date  . CESAREAN SECTION  2009  . CESAREAN SECTION  08/12/2011   Procedure: CESAREAN SECTION;  Surgeon: Mora Bellman, MD;  Location: [facility];  Service: Gynecology;  Laterality: N/A;  . DILATION AND CURETTAGE OF UTERUS  2007    Current Medications: Current Meds  Medication Sig  . norgestimate-ethinyl estradiol (ORTHO-CYCLEN,SPRINTEC,PREVIFEM) 0.25-35 MG-MCG tablet Take 1 tablet by mouth daily.     Allergies:   Penicillins and Citrus   Social History   Social History  . Marital status: Single    Spouse name: N/A  . Number of children: N/A  . Years of education: N/A   Social History Main Topics  . Smoking status: Never Smoker  . Smokeless tobacco: Never Used  . Alcohol use 0.6 oz/week    1 Standard drinks or equivalent per week     Comment: socially  . Drug use: No  . Sexual activity: Yes    Partners: Male    Birth control/ protection: None  Comment: last unprotected intercourse 2 wks afo   Other Topics Concern  . None   Social History Narrative  . None     Family History: The patient's family history includes Heart disease in her brother and mother; Parkinsonism in her brother.  ROS:   Please see the history of present illness.    All other systems reviewed and are negative.  EKGs/Labs/Other Studies Reviewed:    The following studies were reviewed today: I reviewed records from primary care physician's office at extensive length and discussed this with the findings with the patient over satisfaction. Her EKG is  unremarkable.   Recent Labs: 12/17/2016: ALT 7; BUN 11; Creatinine, Ser 0.76; Hemoglobin 13.5; Platelets 191.0; Potassium 4.1; Sodium 137; TSH 1.04  Recent Lipid Panel    Component Value Date/Time   CHOL 150 07/13/2014 1406   TRIG 118.0 07/13/2014 1406   HDL 49.60 07/13/2014 1406   CHOLHDL 3 07/13/2014 1406   VLDL 23.6 07/13/2014 1406   LDLCALC 77 07/13/2014 1406    Physical Exam:    VS:  BP 122/70   Pulse 81   Ht 5\' 6"  (1.676 m)   Wt 171 lb 1.3 oz (77.6 kg)   SpO2 98%   BMI 27.61 kg/m     Wt Readings from Last 3 Encounters:  12/23/16 171 lb 1.3 oz (77.6 kg)  12/17/16 167 lb (75.8 kg)  10/08/14 159 lb (72.1 kg)     GEN: Patient is in no acute distress HEENT: Normal NECK: No JVD; No carotid bruits LYMPHATICS: No lymphadenopathy CARDIAC: S1 S2 regular, 2/6 systolic murmur at the apex. RESPIRATORY:  Clear to auscultation without rales, wheezing or rhonchi  ABDOMEN: Soft, non-tender, non-distended MUSCULOSKELETAL:  No edema; No deformity  SKIN: Warm and dry NEUROLOGIC:  Alert and oriented x 3 PSYCHIATRIC:  Normal affect    Signed, Jenean Lindau, MD  12/23/2016 9:26 AM    [facility]

## 2016-12-29 ENCOUNTER — Other Ambulatory Visit (HOSPITAL_BASED_OUTPATIENT_CLINIC_OR_DEPARTMENT_OTHER): Payer: 59

## 2017-01-22 ENCOUNTER — Ambulatory Visit (HOSPITAL_BASED_OUTPATIENT_CLINIC_OR_DEPARTMENT_OTHER): Payer: 59

## 2021-09-02 LAB — OB RESULTS CONSOLE RPR: RPR: NONREACTIVE

## 2021-09-02 LAB — OB RESULTS CONSOLE GC/CHLAMYDIA
Chlamydia: NEGATIVE
Neisseria Gonorrhea: NEGATIVE

## 2021-09-02 LAB — HEPATITIS C ANTIBODY: HCV Ab: NEGATIVE

## 2021-09-02 LAB — OB RESULTS CONSOLE HEPATITIS B SURFACE ANTIGEN: Hepatitis B Surface Ag: NEGATIVE

## 2021-09-02 LAB — OB RESULTS CONSOLE ABO/RH: RH Type: POSITIVE

## 2021-09-02 LAB — OB RESULTS CONSOLE HIV ANTIBODY (ROUTINE TESTING): HIV: NONREACTIVE

## 2021-09-02 LAB — OB RESULTS CONSOLE ANTIBODY SCREEN: Antibody Screen: NEGATIVE

## 2021-09-02 LAB — OB RESULTS CONSOLE RUBELLA ANTIBODY, IGM: Rubella: NON-IMMUNE/NOT IMMUNE

## 2021-10-15 ENCOUNTER — Other Ambulatory Visit: Payer: Self-pay | Admitting: Obstetrics and Gynecology

## 2021-10-15 ENCOUNTER — Other Ambulatory Visit: Payer: Self-pay

## 2021-10-15 DIAGNOSIS — Z363 Encounter for antenatal screening for malformations: Secondary | ICD-10-CM

## 2021-10-29 ENCOUNTER — Encounter: Payer: Self-pay | Admitting: *Deleted

## 2021-11-04 ENCOUNTER — Ambulatory Visit: Payer: Medicaid Other | Attending: Obstetrics and Gynecology

## 2021-11-04 ENCOUNTER — Encounter: Payer: Self-pay | Admitting: *Deleted

## 2021-11-04 ENCOUNTER — Ambulatory Visit: Payer: Medicaid Other | Admitting: *Deleted

## 2021-11-04 VITALS — BP 126/61 | HR 102

## 2021-11-04 DIAGNOSIS — O3410 Maternal care for benign tumor of corpus uteri, unspecified trimester: Secondary | ICD-10-CM

## 2021-11-04 DIAGNOSIS — Z363 Encounter for antenatal screening for malformations: Secondary | ICD-10-CM | POA: Diagnosis present

## 2021-11-04 DIAGNOSIS — D259 Leiomyoma of uterus, unspecified: Secondary | ICD-10-CM | POA: Insufficient documentation

## 2022-01-13 ENCOUNTER — Other Ambulatory Visit: Payer: Self-pay | Admitting: Obstetrics and Gynecology

## 2022-03-06 ENCOUNTER — Encounter (HOSPITAL_COMMUNITY): Payer: Self-pay

## 2022-03-06 ENCOUNTER — Telehealth (HOSPITAL_COMMUNITY): Payer: Self-pay | Admitting: *Deleted

## 2022-03-06 NOTE — Telephone Encounter (Signed)
Preadmission screen  

## 2022-03-06 NOTE — Patient Instructions (Signed)
[name] [name]  03/06/2022   Your procedure is scheduled on:  03/20/2022  Arrive at 0800 at Entrance C on [address] at [facility]  and [employer]. You are invited to use the FREE valet parking or use the Visitor's parking deck.  Pick up the phone at the desk and dial [phone].  Call this number if you have problems the morning of surgery: [phone]  Remember:   Do not eat food:(After Midnight) Desps de medianoche.  Do not drink clear liquids: (After Midnight) Desps de medianoche.  Take these medicines the morning of surgery with A SIP OF WATER:  none   Do not wear jewelry, make-up or nail polish.  Do not wear lotions, powders, or perfumes. Do not wear deodorant.  Do not shave 48 hours prior to surgery.  Do not bring valuables to the hospital.  [facility] is not   responsible for any belongings or valuables brought to the hospital.  Contacts, dentures or bridgework may not be worn into surgery.  Leave suitcase in the car. After surgery it may be brought to your room.  For patients admitted to the hospital, checkout time is 11:00 AM the day of              discharge.      Please read over the following fact sheets that you were given:     Preparing for Surgery

## 2022-03-13 DIAGNOSIS — B951 Streptococcus, group B, as the cause of diseases classified elsewhere: Secondary | ICD-10-CM | POA: Diagnosis present

## 2022-03-13 DIAGNOSIS — Z98891 History of uterine scar from previous surgery: Secondary | ICD-10-CM

## 2022-03-13 DIAGNOSIS — O3660X0 Maternal care for excessive fetal growth, unspecified trimester, not applicable or unspecified: Secondary | ICD-10-CM | POA: Diagnosis present

## 2022-03-13 NOTE — H&P (Addendum)
[name] [name] is a 32 y.o. female, [id number] at 69 weeks, presenting for scheduled repeat cesarean birth and tubal sterilization, with tubal consent signed 12/16/21.  Patient reports family hx WPW and hypertrophic cardiomyopathy in brother--prior EPIC notes report she had normal echo and EKG prior to past pregnancies, patient without cardiac sx.  Patient Active Problem List   Diagnosis Date Noted   History of miscarriage x 2 03/14/2022   Penicillin allergy 03/14/2022   History of 2 cesarean sections 03/13/2022   Positive GBS test 03/13/2022   LGA (large for gestational age) fetus affecting management of mother 03/13/2022   Family history of hypertrophic cardiomyopathy 12/17/2016   Family history of Wolff-Parkinson-White (WPW) syndrome 12/17/2016    History of present pregnancy: Patient entered care at 10 4/7 weeks.   EDC of 03/27/22 was established by LMP, c/w [place] at 10 4/7 weeks, ? Placental cyst noted on early USs at initial visit and at 14 weeks. Anatomy scan:  19 3/7 weeks at MFM, with normal findings and an anterior placenta, growth at 14th%ile.  No evidence of placental cyst noted on MFM scan.   Additional [place] evaluations:   25 weeks--breech, anterior placenta, EFW 86%ile, normal AFI.   35 4/7 weeks--vtx, anterior placenta, EFW 7 lbs, 90%ile, normal AFI.   Significant prenatal events:  Small fibroid noted on early [place], with [facility] at that time.  Normal genetic testing.  Desired repeat C/S with tubal, consent signed 12/16/21.  Repeat US at 14 weeks, with ? placental cystic structure seen.  Referred to MFM, normal findings, no evidence of placental cyst.  Had bleeding at 18 weeks, no abnormal findings.  GBS positive at 35 weeks.  Noted carpal tunnel sx during pregnancy.  Normal growth on f/u US. Last evaluation:  03/10/22--BP 120/68, weight 233, carpal tunnel sx noted.    OB History     Gravida  6   Para  2   Term  2   Preterm      AB  3   Living  2      SAB  2   IAB  1    Ectopic      Multiple      Live Births  2         2007--SAB 2008--TAB 2009--primary C/S at 82 weeks, female, 9+8, FTP, delivered in [place] 2012--SAB 2013--Repeat LTCS due to FTD/failed TOL, female, 9 lbs, [facility], Dr. Elly Modena.  Past Medical History:  Diagnosis Date   Abnormal Pap smear 03/09/2010   Genital warts    Past Surgical History:  Procedure Laterality Date   CESAREAN SECTION  2009   CESAREAN SECTION  08/12/2011   Procedure: CESAREAN SECTION;  Surgeon: Mora Bellman, MD;  Location: [facility];  Service: Gynecology;  Laterality: N/A;   DILATION AND CURETTAGE OF UTERUS  2007   Family History: family history includes Colon cancer in her mother; Heart disease in her brother and mother; Parkinsonism in her brother.  Brother with WPW and hypertrophic cardiomyopathy.  Social History:  reports that she has never smoked. She has never used smokeless tobacco. She reports current alcohol use of about 1.0 standard drink of alcohol per week. She reports that she does not use drugs.  She is Black, partner is present and supportive.   Prenatal Transfer Tool  Maternal Diabetes: No Genetic Screening: Normal Panorama, Horizon, and AFP Maternal Ultrasounds/Referrals: Normal Fetal Ultrasounds or other Referrals:  None Maternal Substance Abuse:  No Significant Maternal Medications:  None Significant Maternal  Lab Results: Group B Strep positive  TDAP NA Flu NA  ROS:  Positive FM, no cramping/bleeding or discharge, notes good FM, no SOB/chest pain, no other sx of significance  Allergies  Allergen Reactions   Penicillins Hives   Citrus Rash      Last menstrual period 06/20/2021.  Chest clear Heart RRR without murmur Abd gravid, NT, FH 39 weeks Pelvic: Deferred Ext: WNL  FHR: FHR 154 at last office visit UCs:  Occasional  Prenatal labs: ABO, Rh: B/Positive/-- (06/27 0000) Antibody: Negative (06/27 0000) Rubella:  Nonimmune (06/27 0000) RPR: Nonreactive (06/27 0000)   HBsAg: Negative (06/27 0000)  HIV: Non-reactive (06/27 0000)  GBS:  Positive Sickle cell/Hgb electrophoresis:  AA Pap:  ASCUS, neg HPV 02/2022 GC:  Neg 09/03/21 Chlamydia:  Neg 09/03/21 Genetic screenings:  Low risk Panorama and neg Horizon, female, neg AFP Glucola:  WNL Other:   Hgb 12.4 at NOB, 12 at 28 weeks Vit D low at NOB      Assessment/Plan: IUP at 39 weeks Prior C/S x 2, desires repeat with sterilization Hx LGA babies x 2 GBS positive PCN allergic   Plan: [facility] consult with Dr. Mancel Bale for schedule repeat C/S and tubal sterilization. Routine pre-op orders  Donnel Saxon, CNM 03/14/22 8:45a  Risks benefits alternatives discussed with the patient including but not limited to bleeding infection injury risk of regret of sterilization risk of failure (with subsequent likely ectopic).  Pt would like entire tubes removed.

## 2022-03-14 DIAGNOSIS — Z8759 Personal history of other complications of pregnancy, childbirth and the puerperium: Secondary | ICD-10-CM

## 2022-03-14 DIAGNOSIS — Z880 Allergy status to penicillin: Secondary | ICD-10-CM

## 2022-03-18 ENCOUNTER — Encounter (HOSPITAL_COMMUNITY)
Admission: RE | Admit: 2022-03-18 | Discharge: 2022-03-18 | Disposition: A | Discharge status: Home / Self Care | Payer: Medicaid Other | Source: Ambulatory Visit | Attending: Obstetrics and Gynecology | Admitting: Obstetrics and Gynecology

## 2022-03-18 DIAGNOSIS — Z01812 Encounter for preprocedural laboratory examination: Secondary | ICD-10-CM | POA: Diagnosis present

## 2022-03-18 LAB — CBC
HCT: 37.4 % (ref 36.0–46.0)
Hemoglobin: 12.7 g/dL (ref 12.0–15.0)
MCH: 31.7 pg (ref 26.0–34.0)
MCHC: 34 g/dL (ref 30.0–36.0)
MCV: 93.3 fL (ref 80.0–100.0)
Platelets: 189 10*3/uL (ref 150–400)
RBC: 4.01 MIL/uL (ref 3.87–5.11)
RDW: 13.1 % (ref 11.5–15.5)
WBC: 8.4 10*3/uL (ref 4.0–10.5)
nRBC: 0 % (ref 0.0–0.2)

## 2022-03-18 LAB — BASIC METABOLIC PANEL
Anion gap: 10 (ref 5–15)
BUN: 8 mg/dL (ref 6–20)
CO2: 21 mmol/L — ABNORMAL LOW (ref 22–32)
Calcium: 9.2 mg/dL (ref 8.9–10.3)
Chloride: 103 mmol/L (ref 98–111)
Creatinine, Ser: 0.64 mg/dL (ref 0.44–1.00)
GFR, Estimated: >60 mL/min (ref >60)
Glucose, Bld: 160 mg/dL — ABNORMAL HIGH (ref 70–99)
Potassium: 4 mmol/L (ref 3.5–5.1)
Sodium: 134 mmol/L — ABNORMAL LOW (ref 135–145)

## 2022-03-18 LAB — RPR: RPR Ser Ql: NONREACTIVE

## 2022-03-19 NOTE — Anesthesia Preprocedure Evaluation (Addendum)
Anesthesia Evaluation  Patient identified by MRN, date of birth, ID band Patient awake    Reviewed: Allergy & Precautions, NPO status , Patient's Chart, lab work & pertinent test results  Airway Mallampati: II  TM Distance: >3 FB Neck ROM: Full    Dental no notable dental hx.    Pulmonary neg pulmonary ROS   Pulmonary exam normal breath sounds clear to auscultation       Cardiovascular negative cardio ROS Normal cardiovascular exam Rhythm:Regular Rate:Normal     Neuro/Psych negative neurological ROS  negative psych ROS   GI/Hepatic negative GI ROS, Neg liver ROS,,,  Endo/Other  negative endocrine ROS    Renal/GU negative Renal ROS  negative genitourinary   Musculoskeletal negative musculoskeletal ROS (+)    Abdominal   Peds  Hematology negative hematology ROS (+)   Anesthesia Other Findings Repeat CS x2  Reproductive/Obstetrics (+) Pregnancy                             Anesthesia Physical Anesthesia Plan  ASA: 2  Anesthesia Plan: Spinal and Regional   Post-op Pain Management: Regional block* and Ofirmev IV (intra-op)*   Induction:   PONV Risk Score and Plan: Treatment may vary due to age or medical condition  Airway Management Planned: Natural Airway  Additional Equipment:   Intra-op Plan:   Post-operative Plan:   Informed Consent: I have reviewed the patients History and Physical, chart, labs and discussed the procedure including the risks, benefits and alternatives for the proposed anesthesia with the patient or authorized representative who has indicated his/her understanding and acceptance.     Dental advisory given  Plan Discussed with: CRNA  Anesthesia Plan Comments: (Pt requests regional anesthesia for multimodal analgesia. )       Anesthesia Quick Evaluation

## 2022-03-20 ENCOUNTER — Other Ambulatory Visit: Payer: Self-pay

## 2022-03-20 ENCOUNTER — Encounter (HOSPITAL_COMMUNITY)
Admission: RE | Disposition: A | Discharge status: Home / Self Care | Payer: Self-pay | Source: Home / Self Care | Attending: Obstetrics and Gynecology

## 2022-03-20 ENCOUNTER — Encounter (HOSPITAL_COMMUNITY): Payer: Self-pay | Admitting: Obstetrics and Gynecology

## 2022-03-20 ENCOUNTER — Inpatient Hospital Stay (HOSPITAL_COMMUNITY)
Admission: RE | Admit: 2022-03-20 | Discharge: 2022-03-23 | DRG: 784 | Disposition: A | Discharge status: Home / Self Care | Payer: Medicaid Other | Attending: Obstetrics and Gynecology | Admitting: Obstetrics and Gynecology

## 2022-03-20 ENCOUNTER — Inpatient Hospital Stay (HOSPITAL_COMMUNITY): Payer: Medicaid Other | Admitting: Anesthesiology

## 2022-03-20 DIAGNOSIS — Z302 Encounter for sterilization: Secondary | ICD-10-CM | POA: Diagnosis not present

## 2022-03-20 DIAGNOSIS — Z3A39 39 weeks gestation of pregnancy: Secondary | ICD-10-CM

## 2022-03-20 DIAGNOSIS — Z880 Allergy status to penicillin: Secondary | ICD-10-CM

## 2022-03-20 DIAGNOSIS — O99824 Streptococcus B carrier state complicating childbirth: Secondary | ICD-10-CM | POA: Diagnosis present

## 2022-03-20 DIAGNOSIS — O34211 Maternal care for low transverse scar from previous cesarean delivery: Principal | ICD-10-CM | POA: Diagnosis present

## 2022-03-20 DIAGNOSIS — O3663X0 Maternal care for excessive fetal growth, third trimester, not applicable or unspecified: Secondary | ICD-10-CM | POA: Diagnosis present

## 2022-03-20 DIAGNOSIS — Z98891 History of uterine scar from previous surgery: Secondary | ICD-10-CM

## 2022-03-20 DIAGNOSIS — O323XX0 Maternal care for face, brow and chin presentation, not applicable or unspecified: Secondary | ICD-10-CM | POA: Diagnosis present

## 2022-03-20 DIAGNOSIS — B951 Streptococcus, group B, as the cause of diseases classified elsewhere: Secondary | ICD-10-CM | POA: Diagnosis present

## 2022-03-20 DIAGNOSIS — Z8759 Personal history of other complications of pregnancy, childbirth and the puerperium: Secondary | ICD-10-CM

## 2022-03-20 DIAGNOSIS — O3660X0 Maternal care for excessive fetal growth, unspecified trimester, not applicable or unspecified: Secondary | ICD-10-CM | POA: Diagnosis present

## 2022-03-20 HISTORY — PX: TUBAL LIGATION: SHX77

## 2022-03-20 LAB — PREPARE RBC (CROSSMATCH)

## 2022-03-20 LAB — ABO/RH: ABO/RH(D): B POS

## 2022-03-20 SURGERY — Surgical Case
Anesthesia: Spinal

## 2022-03-20 MED ORDER — DEXAMETHASONE SODIUM PHOSPHATE 10 MG/ML IJ SOLN
INTRAMUSCULAR | Status: AC
  Filled 2022-03-20: qty 1

## 2022-03-20 MED ORDER — MENTHOL 3 MG MT LOZG: 1.0000 | LOZENGE | OROMUCOSAL | Status: DC | PRN

## 2022-03-20 MED ORDER — DEXAMETHASONE SODIUM PHOSPHATE 10 MG/ML IJ SOLN
INTRAMUSCULAR | Status: DC | PRN
  Administered 2022-03-20 (×2): 5 mg

## 2022-03-20 MED ORDER — CLINDAMYCIN PHOSPHATE 900 MG/50ML IV SOLN
INTRAVENOUS | Status: AC
  Filled 2022-03-20: qty 50

## 2022-03-20 MED ORDER — IBUPROFEN 600 MG PO TABS
600.0000 mg | ORAL_TABLET | Freq: Four times a day (QID) | ORAL | Status: DC
  Administered 2022-03-21 – 2022-03-23 (×8): 600 mg via ORAL
  Filled 2022-03-20 (×8): qty 1

## 2022-03-20 MED ORDER — KETOROLAC TROMETHAMINE 30 MG/ML IJ SOLN
INTRAMUSCULAR | Status: AC
  Filled 2022-03-20: qty 1

## 2022-03-20 MED ORDER — COCONUT OIL OIL: 1.0000 | TOPICAL_OIL | Status: DC | PRN

## 2022-03-20 MED ORDER — TRANEXAMIC ACID-NACL 1000-0.7 MG/100ML-% IV SOLN
1000.0000 mg | Freq: Once | INTRAVENOUS | Status: AC
  Administered 2022-03-20: 1000 mg via INTRAVENOUS

## 2022-03-20 MED ORDER — OXYTOCIN-SODIUM CHLORIDE 30-0.9 UT/500ML-% IV SOLN: INTRAVENOUS | Status: DC | PRN

## 2022-03-20 MED ORDER — SODIUM CHLORIDE 0.9% FLUSH: 3.0000 mL | INTRAVENOUS | Status: DC | PRN

## 2022-03-20 MED ORDER — CLINDAMYCIN PHOSPHATE 900 MG/50ML IV SOLN
900.0000 mg | Freq: Once | INTRAVENOUS | Status: AC
  Administered 2022-03-20: 900 mg via INTRAVENOUS

## 2022-03-20 MED ORDER — KETOROLAC TROMETHAMINE 30 MG/ML IJ SOLN
30.0000 mg | Freq: Once | INTRAMUSCULAR | Status: AC | PRN
  Administered 2022-03-20: 30 mg via INTRAVENOUS

## 2022-03-20 MED ORDER — WITCH HAZEL-GLYCERIN EX PADS: 1.0000 | MEDICATED_PAD | CUTANEOUS | Status: DC | PRN

## 2022-03-20 MED ORDER — SODIUM CHLORIDE 0.9% IV SOLUTION: Freq: Once | INTRAVENOUS | Status: DC

## 2022-03-20 MED ORDER — DIPHENHYDRAMINE HCL 50 MG/ML IJ SOLN: 12.5000 mg | INTRAMUSCULAR | Status: DC | PRN

## 2022-03-20 MED ORDER — SIMETHICONE 80 MG PO CHEW
80.0000 mg | CHEWABLE_TABLET | Freq: Three times a day (TID) | ORAL | Status: DC
  Administered 2022-03-21 – 2022-03-23 (×3): 80 mg via ORAL
  Filled 2022-03-20 (×4): qty 1

## 2022-03-20 MED ORDER — MISOPROSTOL 200 MCG PO TABS
ORAL_TABLET | ORAL | Status: AC
  Filled 2022-03-20: qty 4

## 2022-03-20 MED ORDER — ONDANSETRON HCL 4 MG/2ML IJ SOLN
INTRAMUSCULAR | Status: AC
  Filled 2022-03-20: qty 2

## 2022-03-20 MED ORDER — ROPIVACAINE HCL 5 MG/ML IJ SOLN
INTRAMUSCULAR | Status: DC | PRN
  Administered 2022-03-20 (×2): 25 mL via PERINEURAL

## 2022-03-20 MED ORDER — ONDANSETRON HCL 4 MG/2ML IJ SOLN: 4.0000 mg | Freq: Three times a day (TID) | INTRAMUSCULAR | Status: DC | PRN

## 2022-03-20 MED ORDER — SIMETHICONE 80 MG PO CHEW: 80.0000 mg | CHEWABLE_TABLET | ORAL | Status: DC | PRN

## 2022-03-20 MED ORDER — PHENYLEPHRINE HCL (PRESSORS) 10 MG/ML IV SOLN
INTRAVENOUS | Status: DC | PRN
  Administered 2022-03-20 (×2): 80 ug via INTRAVENOUS

## 2022-03-20 MED ORDER — METHYLERGONOVINE MALEATE 0.2 MG/ML IJ SOLN
INTRAMUSCULAR | Status: AC
  Filled 2022-03-20: qty 1

## 2022-03-20 MED ORDER — FENTANYL CITRATE (PF) 100 MCG/2ML IJ SOLN: 50.0000 ug | INTRAMUSCULAR | Status: DC | PRN

## 2022-03-20 MED ORDER — OXYTOCIN-SODIUM CHLORIDE 30-0.9 UT/500ML-% IV SOLN: 2.5000 [IU]/h | INTRAVENOUS | Status: AC

## 2022-03-20 MED ORDER — OXYTOCIN-SODIUM CHLORIDE 30-0.9 UT/500ML-% IV SOLN
INTRAVENOUS | Status: AC
  Filled 2022-03-20: qty 500

## 2022-03-20 MED ORDER — POVIDONE-IODINE 10 % EX SWAB
2.0000 | Freq: Once | CUTANEOUS | Status: AC
  Administered 2022-03-20: 2 via TOPICAL

## 2022-03-20 MED ORDER — SODIUM CHLORIDE 0.9 % IR SOLN
Status: DC | PRN
  Administered 2022-03-20: 1

## 2022-03-20 MED ORDER — LACTATED RINGERS IV SOLN: INTRAVENOUS | Status: DC

## 2022-03-20 MED ORDER — TRANEXAMIC ACID-NACL 1000-0.7 MG/100ML-% IV SOLN
1000.0000 mg | INTRAVENOUS | Status: AC
  Administered 2022-03-20: 1000 mg via INTRAVENOUS

## 2022-03-20 MED ORDER — KETOROLAC TROMETHAMINE 30 MG/ML IJ SOLN: 30.0000 mg | Freq: Four times a day (QID) | INTRAMUSCULAR | Status: AC | PRN

## 2022-03-20 MED ORDER — MISOPROSTOL 200 MCG PO TABS
800.0000 ug | ORAL_TABLET | Freq: Once | ORAL | Status: AC
  Administered 2022-03-20: 800 ug via RECTAL

## 2022-03-20 MED ORDER — DIPHENHYDRAMINE HCL 25 MG PO CAPS: 25.0000 mg | ORAL_CAPSULE | Freq: Four times a day (QID) | ORAL | Status: DC | PRN

## 2022-03-20 MED ORDER — PROMETHAZINE HCL 25 MG/ML IJ SOLN
6.2500 mg | Freq: Once | INTRAMUSCULAR | Status: DC
  Filled 2022-03-20: qty 1

## 2022-03-20 MED ORDER — NALOXONE HCL 0.4 MG/ML IJ SOLN: 0.4000 mg | INTRAMUSCULAR | Status: DC | PRN

## 2022-03-20 MED ORDER — DIBUCAINE (PERIANAL) 1 % EX OINT: 1.0000 | TOPICAL_OINTMENT | CUTANEOUS | Status: DC | PRN

## 2022-03-20 MED ORDER — SCOPOLAMINE 1 MG/3DAYS TD PT72
MEDICATED_PATCH | TRANSDERMAL | Status: AC
  Filled 2022-03-20: qty 1

## 2022-03-20 MED ORDER — PHENYLEPHRINE 80 MCG/ML (10ML) SYRINGE FOR IV PUSH (FOR BLOOD PRESSURE SUPPORT)
PREFILLED_SYRINGE | INTRAVENOUS | Status: AC
  Filled 2022-03-20: qty 30

## 2022-03-20 MED ORDER — METHYLERGONOVINE MALEATE 0.2 MG PO TABS
0.2000 mg | ORAL_TABLET | Freq: Four times a day (QID) | ORAL | Status: AC
  Administered 2022-03-20 – 2022-03-21 (×4): 0.2 mg via ORAL
  Filled 2022-03-20 (×4): qty 1

## 2022-03-20 MED ORDER — ACETAMINOPHEN 500 MG PO TABS
1000.0000 mg | ORAL_TABLET | Freq: Four times a day (QID) | ORAL | Status: DC
  Administered 2022-03-20 – 2022-03-23 (×10): 1000 mg via ORAL
  Filled 2022-03-20 (×10): qty 2

## 2022-03-20 MED ORDER — PRENATAL MULTIVITAMIN CH
1.0000 | ORAL_TABLET | Freq: Every day | ORAL | Status: DC
  Administered 2022-03-23: 1 via ORAL
  Filled 2022-03-20 (×2): qty 1

## 2022-03-20 MED ORDER — SENNOSIDES-DOCUSATE SODIUM 8.6-50 MG PO TABS
2.0000 | ORAL_TABLET | Freq: Every day | ORAL | Status: DC
  Administered 2022-03-21 – 2022-03-23 (×3): 2 via ORAL
  Filled 2022-03-20 (×3): qty 2

## 2022-03-20 MED ORDER — FENTANYL CITRATE (PF) 100 MCG/2ML IJ SOLN: 25.0000 ug | INTRAMUSCULAR | Status: DC | PRN

## 2022-03-20 MED ORDER — KETOROLAC TROMETHAMINE 30 MG/ML IJ SOLN
30.0000 mg | Freq: Four times a day (QID) | INTRAMUSCULAR | Status: AC
  Administered 2022-03-21 (×2): 30 mg via INTRAVENOUS
  Filled 2022-03-20 (×2): qty 1

## 2022-03-20 MED ORDER — DEXAMETHASONE SODIUM PHOSPHATE 10 MG/ML IJ SOLN
INTRAMUSCULAR | Status: DC | PRN
  Administered 2022-03-20: 10 mg via INTRAVENOUS

## 2022-03-20 MED ORDER — METHYLERGONOVINE MALEATE 0.2 MG/ML IJ SOLN
0.2000 mg | Freq: Once | INTRAMUSCULAR | Status: AC
  Administered 2022-03-20: 0.2 mg via INTRAMUSCULAR

## 2022-03-20 MED ORDER — OXYTOCIN-SODIUM CHLORIDE 30-0.9 UT/500ML-% IV SOLN
INTRAVENOUS | Status: DC | PRN
  Administered 2022-03-20: 300 mL via INTRAVENOUS

## 2022-03-20 MED ORDER — PHENYLEPHRINE HCL-NACL 20-0.9 MG/250ML-% IV SOLN
INTRAVENOUS | Status: DC | PRN
  Administered 2022-03-20: 60 ug/min via INTRAVENOUS

## 2022-03-20 MED ORDER — ACETAMINOPHEN 500 MG PO TABS: 1000.0000 mg | ORAL_TABLET | Freq: Four times a day (QID) | ORAL | Status: DC

## 2022-03-20 MED ORDER — GENTAMICIN SULFATE 40 MG/ML IJ SOLN
5.0000 mg/kg | Freq: Once | INTRAVENOUS | Status: AC
  Administered 2022-03-20: 380 mg via INTRAVENOUS
  Filled 2022-03-20: qty 9.5

## 2022-03-20 MED ORDER — MORPHINE SULFATE (PF) 0.5 MG/ML IJ SOLN
INTRAMUSCULAR | Status: DC | PRN
  Administered 2022-03-20: 150 ug via INTRATHECAL

## 2022-03-20 MED ORDER — GABAPENTIN 100 MG PO CAPS: 300.0000 mg | ORAL_CAPSULE | Freq: Two times a day (BID) | ORAL | Status: DC | PRN

## 2022-03-20 MED ORDER — OXYCODONE HCL 5 MG PO TABS: 5.0000 mg | ORAL_TABLET | ORAL | Status: DC | PRN

## 2022-03-20 MED ORDER — BUPIVACAINE IN DEXTROSE 0.75-8.25 % IT SOLN
INTRATHECAL | Status: DC | PRN
  Administered 2022-03-20: 1.8 mL via INTRATHECAL

## 2022-03-20 MED ORDER — DIPHENHYDRAMINE HCL 25 MG PO CAPS: 25.0000 mg | ORAL_CAPSULE | ORAL | Status: DC | PRN

## 2022-03-20 MED ORDER — ZOLPIDEM TARTRATE 5 MG PO TABS: 5.0000 mg | ORAL_TABLET | Freq: Every evening | ORAL | Status: DC | PRN

## 2022-03-20 MED ORDER — NALOXONE HCL 4 MG/10ML IJ SOLN: 1.0000 ug/kg/h | INTRAVENOUS | Status: DC | PRN

## 2022-03-20 MED ORDER — MORPHINE SULFATE (PF) 0.5 MG/ML IJ SOLN
INTRAMUSCULAR | Status: AC
  Filled 2022-03-20: qty 10

## 2022-03-20 MED ORDER — PHENYLEPHRINE HCL-NACL 20-0.9 MG/250ML-% IV SOLN
INTRAVENOUS | Status: AC
  Filled 2022-03-20: qty 250

## 2022-03-20 MED ORDER — FENTANYL CITRATE (PF) 100 MCG/2ML IJ SOLN
INTRAMUSCULAR | Status: AC
  Filled 2022-03-20: qty 2

## 2022-03-20 MED ORDER — ACETAMINOPHEN 10 MG/ML IV SOLN
INTRAVENOUS | Status: DC | PRN
  Administered 2022-03-20: 1000 mg via INTRAVENOUS

## 2022-03-20 MED ORDER — SCOPOLAMINE 1 MG/3DAYS TD PT72
1.0000 | MEDICATED_PATCH | Freq: Once | TRANSDERMAL | Status: AC
  Administered 2022-03-20: 1.5 mg via TRANSDERMAL

## 2022-03-20 MED ORDER — SODIUM CHLORIDE 0.9 % IV SOLN
6.2500 mg | Freq: Four times a day (QID) | INTRAVENOUS | Status: DC | PRN
  Administered 2022-03-20: 6.25 mg via INTRAVENOUS
  Filled 2022-03-20: qty 0.25

## 2022-03-20 MED ORDER — TRANEXAMIC ACID-NACL 1000-0.7 MG/100ML-% IV SOLN
INTRAVENOUS | Status: AC
  Filled 2022-03-20: qty 100

## 2022-03-20 MED ORDER — FENTANYL CITRATE (PF) 100 MCG/2ML IJ SOLN
INTRAMUSCULAR | Status: DC | PRN
  Administered 2022-03-20: 15 ug via INTRATHECAL

## 2022-03-20 MED ORDER — ONDANSETRON HCL 4 MG/2ML IJ SOLN
INTRAMUSCULAR | Status: DC | PRN
  Administered 2022-03-20: 4 mg via INTRAVENOUS

## 2022-03-20 SURGICAL SUPPLY — 36 items
BENZOIN TINCTURE PRP APPL 2/3 (GAUZE/BANDAGES/DRESSINGS) ×2 IMPLANT
CHLORAPREP W/TINT 26 (MISCELLANEOUS) ×4 IMPLANT
CLAMP UMBILICAL CORD (MISCELLANEOUS) ×2 IMPLANT
CLOTH BEACON ORANGE TIMEOUT ST (SAFETY) ×2 IMPLANT
DRSG OPSITE POSTOP 4X10 (GAUZE/BANDAGES/DRESSINGS) ×2 IMPLANT
ELECT REM PT RETURN 9FT ADLT (ELECTROSURGICAL) ×2
ELECTRODE REM PT RTRN 9FT ADLT (ELECTROSURGICAL) ×2 IMPLANT
EXTRACTOR VACUUM BELL CUP MITY (SUCTIONS) IMPLANT
EXTRACTOR VACUUM M CUP 4 TUBE (SUCTIONS) IMPLANT
GLOVE BIO SURGEON STRL SZ7.5 (GLOVE) ×2 IMPLANT
GLOVE BIOGEL PI IND STRL 7.0 (GLOVE) ×2 IMPLANT
GLOVE BIOGEL PI IND STRL 7.5 (GLOVE) ×2 IMPLANT
GOWN STRL REUS W/TWL LRG LVL3 (GOWN DISPOSABLE) ×4 IMPLANT
KIT ABG SYR 3ML LUER SLIP (SYRINGE) IMPLANT
LIGASURE IMPACT 36 18CM CVD LR (INSTRUMENTS) IMPLANT
MAT PREVALON FULL STRYKER (MISCELLANEOUS) IMPLANT
NDL HYPO 25X5/8 SAFETYGLIDE (NEEDLE) IMPLANT
NEEDLE HYPO 25X5/8 SAFETYGLIDE (NEEDLE) IMPLANT
NS IRRIG 1000ML POUR BTL (IV SOLUTION) ×2 IMPLANT
PACK C SECTION WH (CUSTOM PROCEDURE TRAY) ×2 IMPLANT
PAD OB MATERNITY 4.3X12.25 (PERSONAL CARE ITEMS) ×2 IMPLANT
RTRCTR C-SECT PINK 25CM LRG (MISCELLANEOUS) ×2 IMPLANT
STRIP CLOSURE SKIN 1/2X4 (GAUZE/BANDAGES/DRESSINGS) ×2 IMPLANT
SUT CHROMIC 2 0 CT 1 (SUTURE) ×2 IMPLANT
SUT MNCRL 0 VIOLET CTX 36 (SUTURE) ×2 IMPLANT
SUT MNCRL AB 3-0 PS2 27 (SUTURE) ×2 IMPLANT
SUT MONOCRYL 0 CTX 36 (SUTURE) ×2
SUT PLAIN 2 0 XLH (SUTURE) ×2 IMPLANT
SUT VIC AB 0 CT1 36 (SUTURE) ×2 IMPLANT
SUT VIC AB 0 CTX 36 (SUTURE) ×8
SUT VIC AB 0 CTX36XBRD ANBCTRL (SUTURE) ×6 IMPLANT
SUT VIC AB 2-0 SH 27 (SUTURE)
SUT VIC AB 2-0 SH 27XBRD (SUTURE) IMPLANT
TOWEL OR 17X24 6PK STRL BLUE (TOWEL DISPOSABLE) ×2 IMPLANT
TRAY FOLEY W/BAG SLVR 14FR LF (SET/KITS/TRAYS/PACK) ×2 IMPLANT
WATER STERILE IRR 1000ML POUR (IV SOLUTION) ×2 IMPLANT

## 2022-03-20 NOTE — Lactation Note (Signed)
This note was copied from a baby's chart.  NICU Lactation Consultation Note  Patient Name: [name] [name] [id number] Date: 03/20/2022 Age:34 hours   Subjective Reason for consult: Initial assessment; NICU baby; Term  Lactation conducted initial appointment and set up DEBP and helped patient initiate breast pumping. Patient wishes to breastfeed and states that she is very motivated. Infant is currently on 4L oxygen, but patient may hold STS and allow infant to nuzzle. Lactation to follow up and assist with breastfeeding, when indicated.  I placed a Vocera call to the NICU RN, [name] [name], for an update while in room with parent.  Objective Infant data: Mother's Current Feeding Choice: Breast Milk and Donor Milk  Maternal data: [id number]  C-Section, Vacuum Assisted Current breast feeding challenges:: NICU; 1500 cc blood loss at delivery  Does the patient have breastfeeding experience prior to this delivery?: Yes How long did the patient breastfeed?: #1 - 2 weeks; #2 - formula exclusively  Pumping frequency: recommend q2-3 hours or 8+ times/day Pumped volume: 0 mL Flange Size: 24  Risk factor for low milk supply:: 1500 blood loss at delivery  Pump: Personal (S1)  Assessment Maternal: Milk volume: Normal   Intervention/Plan Interventions: Breast feeding basics reviewed; Hand express; DEBP; Education  Tools: Pump; Flanges Pump Education: Setup, frequency, and cleaning  Plan: Consult Status: NICU follow-up  NICU Follow-up type: New admission follow up    [name] [name] 03/20/2022, 5:27 PM

## 2022-03-20 NOTE — Transfer of Care (Signed)
Immediate Anesthesia Transfer of Care Note  Patient: [name] [name]  Procedure(s) Performed: REPEAT CESAREAN SECTION BILATERAL TUBAL LIGATION (Bilateral)  Patient Location: PACU  Anesthesia Type:Spinal  Level of Consciousness: awake, alert , and oriented  Airway & Oxygen Therapy: Patient Spontanous Breathing  Post-op Assessment: Report given to RN and Post -op Vital signs reviewed and stable  Post vital signs: Reviewed and stable BP 128/69  Last Vitals:  Vitals Value Taken Time  BP    Temp    Pulse 73 03/20/22 1220  Resp 19 03/20/22 1220  SpO2 100 % 03/20/22 1220  Vitals shown include unvalidated device data.  Last Pain:  Vitals:   03/20/22 0836  TempSrc: Oral         Complications: No notable events documented.

## 2022-03-20 NOTE — Progress Notes (Signed)
Called by RN d/t continued trickle of blood from uterus s/p two 1g IV doses of txa, 873mg of cytotec rectally and methergine.  950cc EBL in PACU including what I removed.  Small blood clots removed (about 150cc) with uterine exploration upon my arrival.  Pt comfortable throughout and VSS.  Will cont methergine series.

## 2022-03-20 NOTE — Consult Note (Signed)
Pharmacy Antibiotic Note  [name] [name] is a 32 y.o. female admitted on 03/20/2022 for cesarean delivery.  Pharmacy has been consulted for Gentamicin dosing for surgical prophylaxis. Pt is allergic to PCNs (rash). Also receiving clindamycin for prophylaxis.  Plan: Gentamicin '5mg'$ /kg (adjusted weight) IV x1 Clindamycin '900mg'$  IV x1   Height: '5\' 5"'$  (165.1 cm) Weight: 105.9 kg (233 lb 6.4 oz) IBW/kg (Calculated) : 57  Temp (24hrs), Avg:98 F (36.7 C), Min:98 F (36.7 C), Max:98 F (36.7 C)  Recent Labs  Lab 03/18/22 0943  WBC 8.4  CREATININE 0.64    Estimated Creatinine Clearance: 121 mL/min (by C-G formula based on SCr of 0.64 mg/dL).    Allergies  Allergen Reactions   Penicillins Hives   Citrus Rash    Antimicrobials this admission: Clindamycin 1/12 Gentamicin 1/12  Dose adjustments this admission:   Microbiology results:   Thank you for allowing pharmacy to be a part of this patient's care.  Juanell Fairly, PharmD, BCPPS 03/20/2022 10:15 AM

## 2022-03-20 NOTE — Anesthesia Procedure Notes (Signed)
Spinal  Patient location during procedure: OR Start time: 03/20/2022 10:28 AM End time: 03/20/2022 10:31 AM Reason for block: surgical anesthesia Staffing Performed: anesthesiologist  Anesthesiologist: Freddrick March, MD Performed by: Freddrick March, MD Authorized by: Freddrick March, MD   Preanesthetic Checklist Completed: patient identified, IV checked, risks and benefits discussed, surgical consent, monitors and equipment checked, pre-op evaluation and timeout performed Spinal Block Patient position: sitting Prep: DuraPrep and site prepped and draped Patient monitoring: cardiac monitor, continuous pulse ox and blood pressure Approach: midline Location: L3-4 Injection technique: single-shot Needle Needle type: Pencan  Needle gauge: 24 G Needle length: 9 cm Assessment Sensory level: T6 Events: CSF return Additional Notes Functioning IV was confirmed and monitors were applied. Sterile prep and drape, including hand hygiene and sterile gloves were used. The patient was positioned and the spine was prepped. The skin was anesthetized with lidocaine.  Free flow of clear CSF was obtained prior to injecting local anesthetic into the CSF.  The spinal needle aspirated freely following injection.  The needle was carefully withdrawn.  The patient tolerated the procedure well.

## 2022-03-20 NOTE — Anesthesia Procedure Notes (Signed)
Anesthesia Regional Block: Quadratus lumborum   Pre-Anesthetic Checklist: , timeout performed,  Correct Patient, Correct Site, Correct Laterality,  Correct Procedure, Correct Position, site marked,  Risks and benefits discussed,  Pre-op evaluation,  At surgeon's request and post-op pain management  Laterality: Left  Prep: Maximum Sterile Barrier Precautions used, chloraprep       Needles:  Injection technique: Single-shot  Needle Type: Echogenic Stimulator Needle     Needle Length: 9cm  Needle Gauge: 21     Additional Needles:   Procedures:,,,, ultrasound used (permanent image in chart),,    Narrative:  Start time: 03/20/2022 10:00 AM End time: 03/20/2022 10:09 AM Injection made incrementally with aspirations every 5 mL. Anesthesiologist: Freddrick March, MD

## 2022-03-20 NOTE — Anesthesia Postprocedure Evaluation (Signed)
Anesthesia Post Note  Patient: [name] [name]  Procedure(s) Performed: REPEAT CESAREAN SECTION BILATERAL TUBAL LIGATION (Bilateral)     Patient location during evaluation: PACU Anesthesia Type: Spinal and Regional Level of consciousness: oriented and awake and alert Pain management: pain level controlled Vital Signs Assessment: post-procedure vital signs reviewed and stable Respiratory status: spontaneous breathing, respiratory function stable and patient connected to nasal cannula oxygen Cardiovascular status: blood pressure returned to baseline and stable Postop Assessment: no headache, no backache and no apparent nausea or vomiting Anesthetic complications: no  No notable events documented.  Last Vitals:  Vitals:   03/20/22 1445 03/20/22 1507  BP: (!) 142/89 137/71  Pulse: 96 86  Resp: 20 20  Temp:  37.6 C  SpO2: 97% 96%    Last Pain:  Vitals:   03/20/22 1515  TempSrc:   PainSc: 0-No pain   Pain Goal:                Epidural/Spinal Function Cutaneous sensation: Tingles (03/20/22 1515), Patient able to flex knees: Yes (03/20/22 1515), Patient able to lift hips off bed: Yes (03/20/22 1515), Back pain beyond tenderness at insertion site: No (03/20/22 1515), Progressively worsening motor and/or sensory loss: No (03/20/22 1515), Bowel and/or bladder incontinence post epidural: No (03/20/22 1515)  [place]

## 2022-03-20 NOTE — Anesthesia Procedure Notes (Signed)
Anesthesia Regional Block: Quadratus lumborum   Pre-Anesthetic Checklist: , timeout performed,  Correct Patient, Correct Site, Correct Laterality,  Correct Procedure, Correct Position, site marked,  Risks and benefits discussed,  Pre-op evaluation,  At surgeon's request and post-op pain management  Laterality: Right  Prep: Maximum Sterile Barrier Precautions used, chloraprep       Needles:  Injection technique: Single-shot  Needle Type: Echogenic Stimulator Needle     Needle Length: 9cm  Needle Gauge: 21     Additional Needles:   Procedures:,,,, ultrasound used (permanent image in chart),,    Narrative:  Start time: 03/20/2022 10:10 AM End time: 03/20/2022 10:16 AM Injection made incrementally with aspirations every 5 mL. Anesthesiologist: Freddrick March, MD

## 2022-03-20 NOTE — Op Note (Signed)
Cesarean Section Procedure Note  Indications: [id number] at 55 6/7wks who presents for scheduled repeat c-section and sterilization via bilateral salpingectomy.  Pre-operative Diagnosis: 1.H/O CESAREAN SECTION 2.DESIRES STERILIZATION   Post-operative Diagnosis: 1.H/O CESAREAN SECTION 2.DESIRES STERILIZATION  Procedure: 1.REPEAT CESAREAN SECTION 2.BILATERAL SALPINGECTOMY  Surgeon: Everett Graff, MD    Assistants: Donnel Saxon, CNM  Anesthesia: Spinal  Anesthesiologist: Freddrick March, MD   Procedure Details  The patient was taken to the operating room after the risks, benefits, complications, treatment options, and expected outcomes were discussed with the patient.  The patient concurred with the proposed plan, giving informed consent which was signed and witnessed. The patient was taken to Operating Room B, identified as [name] [name] and the procedure verified as C-Section Delivery. A Time Out was held and the above information confirmed.  After induction of anesthesia by obtaining a surgical level via the spinal, the patient was prepped and draped in the usual sterile manner. A Pfannenstiel skin incision was made and carried down through the subcutaneous tissue to the underlying layer of fascia.  The fascia was incised bilaterally and extended transversely bilaterally with the Mayo scissors. Kocher clamps were placed on the inferior aspect of the fascial incision and the underlying rectus muscle was separated from the fascia. The same was done on the superior aspect of the fascial incision.  The peritoneum was identified, entered bluntly and extended manually. An Alexis self-retaining retractor was placed.  The utero-vesical peritoneal reflection was incised transversely and the bladder flap was bluntly freed from the lower uterine segment. A low transverse uterine incision was made with the scalpel and extended bilaterally with the bandage scissors.  The infant was delivered in  vertex position with initially a face presentation (mentum posterior) that was converted to LOA by flexing head. After the umbilical cord was clamped and cut, the infant was handed to the awaiting pediatricians.  Cord blood was obtained for evaluation.  The placenta was removed intact and appeared to be within normal limits. The uterus was cleared of all clots and debris. The uterine incision was closed with running interlocking sutures of 0 Vicryl and a second imbricating layer was performed as well.   Bilateral tubes and ovaries appeared to be within normal limits.  Good hemostasis was noted.  Copious irrigation was performed until clear.  The uterus was exteriorized and the left fallopian tube grasped in the midportion with a babcock after carrying it out to its fimbriated end and excised with the ligasure.  The same was done on the contralateral side.  The peritoneum was repaired with 2-0 chromic via a running suture.  The fascia was reapproximated with a running suture of 0 Vicryl. The subcutaneous tissue was reapproximated with 3 interrupted sutures of 2-0 plain.  The skin was reapproximated with a subcuticular suture of 3-0 monocryl.  Steristrips were applied.  Instrument, sponge, and needle counts were correct prior to abdominal closure and at the conclusion of the case.  The patient was awaiting transfer to the recovery room in good condition.  Findings: Live female infant with Apgars 8 at one minute and not recorded yet at five minutes.  Baby doing well overall but not consistently keeping O2 sat up so taken to NICU for observation.  Normal appearing bilateral ovaries and fallopian tubes were noted.  Estimated Blood Loss:  297 ml         Drains: foley to gravity 300 cc         Total IV Fluids: 1000  ml         Specimens to Pathology: Bilateral Portion of Tubes (placenta to L&D)         Complications:  None; patient tolerated the procedure well.         Disposition: PACU - hemodynamically  stable.         Condition: stable  Attending Attestation: I performed the procedure.  I was present and scrubbed and the assistant was required due to complexity of anatomy.

## 2022-03-21 LAB — CBC
HCT: 32.6 % — ABNORMAL LOW (ref 36.0–46.0)
Hemoglobin: 11.5 g/dL — ABNORMAL LOW (ref 12.0–15.0)
MCH: 32.1 pg (ref 26.0–34.0)
MCHC: 35.3 g/dL (ref 30.0–36.0)
MCV: 91.1 fL (ref 80.0–100.0)
Platelets: 157 10*3/uL (ref 150–400)
RBC: 3.58 MIL/uL — ABNORMAL LOW (ref 3.87–5.11)
RDW: 13.1 % (ref 11.5–15.5)
WBC: 13.8 10*3/uL — ABNORMAL HIGH (ref 4.0–10.5)
nRBC: 0 % (ref 0.0–0.2)

## 2022-03-21 NOTE — Progress Notes (Signed)
Subjective: Postpartum Day 1: Cesarean Delivery Patient reports incisional pain, tolerating PO, and no problems voiding.  Small clots when voiding but otherwise minimal VB.  Baby still in NICU on 21% O2 and improving.  Objective: Vital signs in last 24 hours: Temp:  [97.7 F (36.5 C)-99.6 F (37.6 C)] 98.4 F (36.9 C) (01/13 0330) Pulse Rate:  [64-122] 64 (01/13 0330) Resp:  [12-26] 16 (01/13 0330) BP: (110-148)/(60-89) 110/67 (01/13 0330) SpO2:  [96 %-100 %] 96 % (01/12 1507)  Physical Exam:  General: alert and no distress Lochia: appropriate Uterine Fundus: FF, NT Incision: dressing c/d/i DVT Evaluation: no calf tenderness  Recent Labs    03/21/22 0422  HGB 11.5*  HCT 32.6*    Assessment/Plan: Status post Cesarean section. Doing well postoperatively.  Continue current care. Encourage OOB and IS Cont SCDs Cont routine PP care Hemodynamically doing well  Delice Lesch, MD 03/21/2022, 10:32 AM

## 2022-03-22 LAB — BPAM RBC
Blood Product Expiration Date: 202401302359
Blood Product Expiration Date: 202401312359
Unit Type and Rh: 7300
Unit Type and Rh: 7300

## 2022-03-22 LAB — TYPE AND SCREEN
ABO/RH(D): B POS
Antibody Screen: NEGATIVE
Unit division: 0
Unit division: 0

## 2022-03-22 MED ORDER — METHYLERGONOVINE MALEATE 0.2 MG/ML IJ SOLN
INTRAMUSCULAR | Status: AC
  Filled 2022-03-22: qty 1

## 2022-03-22 NOTE — Lactation Note (Signed)
This note was copied from a baby's chart. Lactation Consultation Note  Patient Name: [name] [name] [id number] Date: 03/22/2022 Reason for consult: Follow-up assessment;Term;Difficult latch Age:34 hours  LC in to visit with P3 Mom of term baby delivered by C/Section.  Baby a little over 24 hrs in the NICU for respiratory support.  Mom reports baby latched once in NICU, latch score of 8 given by NICU RN.  But since then, baby has been fussy and not latching to the breast. Baby is at a 8.1% weight loss.  Baby has been supplemented with donor breast milk by paced bottle, volumes of 10-17 ml.    LC noted baby sucking vigorously on pacifier after having 17 ml of donor breast milk.  Offered to assist with a latch to the breast.  Baby fussy with any handling.  Mom's areola is compressible, colostrum drops easily expressed.  Baby became more and more fussy.  Baby calmed down when placed STS on Mom's chest.  In stead of a pacifier, LC recommended offering her EBM +/or donor milk by paced bottle.  Baby took 17 ml more in upright side lying position against Mom's chest.  FOB holding baby while LC made a hand's free pumping band.  Mom encouraged to increase her frequency of pumping as long as baby is not latching and needing supplementation.   Reassured parents that babies transitioning from NG feedings and O2 supplementation need some time.    Reviewed engorgement prevention and treatment.   Talked about increasing baby's supplement to 20-30 ml if not latching to the breast. Recommended OP lactation support, message sent to clinic.  LATCH Score Latch: Too sleepy or reluctant, no latch achieved, no sucking elicited.  Audible Swallowing: None  Type of Nipple: Everted at rest and after stimulation  Comfort (Breast/Nipple): Soft / non-tender  Hold (Positioning): Full assist, staff holds infant at breast  LATCH Score: 4   Lactation Tools Discussed/Used Tools: Pump;Flanges;Bottle;Hands-free  pumping top Flange Size: 24 Breast pump type: Double-Electric Breast Pump;Manual Pump Education: Setup, frequency, and cleaning;Milk Storage Reason for Pumping: Support milk supply/infant being supplemented Pumping frequency: Mom has been pumping every 3-4 hrs Pumped volume: 0 mL (drops)  Interventions Interventions: Breast feeding basics reviewed;Assisted with latch;Skin to skin;Breast massage;Hand express;Adjust position;Support pillows;Position options;DEBP;Education;Pace feeding;LC Services brochure  Discharge Discharge Education: Engorgement and breast care;Warning signs for feeding baby;Outpatient recommendation;Outpatient Epic message sent Pump: Personal (Spectra 2)  Consult Status Consult Status: Follow-up Date: 03/23/22 Follow-up type: In-patient    Broadus John 03/22/2022, 10:33 AM

## 2022-03-22 NOTE — Progress Notes (Signed)
Subjective: Postpartum Day 2: Cesarean Delivery Patient reports incisional pain, tolerating PO, and no problems voiding.  Breast feeding going slow d/t difficulty with baby latching.  Pt is very happy though now that baby is in the room with her and no longer in NICU.  Bleeding is reported as less than a period and less than yesterday.  Objective: Vital signs in last 24 hours: Temp:  [97.8 F (36.6 C)-98.1 F (36.7 C)] 98.1 F (36.7 C) (01/14 0537) Pulse Rate:  [68-77] 77 (01/14 0537) Resp:  [18] 18 (01/14 0537) BP: (117-118)/(62-73) 117/62 (01/14 0537) SpO2:  [97 %-100 %] 100 % (01/14 0537)  Physical Exam:  General: alert and mild distress Lochia: appropriate Uterine Fundus: FF, mildly tender Incision: dressing c/d/i DVT Evaluation: no calf tenderness, SCDs overnight  Recent Labs    03/21/22 0422  HGB 11.5*  HCT 32.6*    Assessment/Plan: Status post Cesarean section. Postoperative course complicated by PPH although Hgb 11.5 on POD 1 and pt asymptomatic.  Will cont to observe.   Continue current care. Plan discharge tomorrow so pt can have more time with lactation consultants and nurses to aid with breast feeding. Mild fundal tenderness but afebrile and appropriately elevated WBC.  Will cont to observe for s/sxs of endometritis as pt did have manual removal of blood clots in PACU at time of [place].  Delice Lesch, MD 03/22/2022, 3:12 PM

## 2022-03-23 LAB — SURGICAL PATHOLOGY

## 2022-03-23 MED ORDER — OXYCODONE HCL 5 MG PO TABS: 5.0000 mg | ORAL_TABLET | ORAL | 0 refills | Status: DC | PRN

## 2022-03-23 MED ORDER — SENNOSIDES-DOCUSATE SODIUM 8.6-50 MG PO TABS: 2.0000 | ORAL_TABLET | Freq: Every day | ORAL | 0 refills | Status: AC

## 2022-03-23 MED ORDER — IBUPROFEN 600 MG PO TABS: 600.0000 mg | ORAL_TABLET | Freq: Four times a day (QID) | ORAL | 0 refills | Status: DC

## 2022-03-23 NOTE — Lactation Note (Signed)
This note was copied from a baby's chart. Lactation Consultation Note  Patient Name: [name] [name] [id number] Date: 03/23/2022 Age: 34 hours old  Reason for consult: Follow-up assessment;Term;Infant weight loss;Difficult latch (8 % weight loss, Bilirubin WNL for age. milk coming in , per mom the most she has pumped off is 30 ml) Baby was a transfer from NICU 03/21/2022  As LC entered the room , mom just finishing a feeding from the bottle , white nipple / tolerating well.  Per mom the baby latched once during the night for 5 mins.  LC reassured mom transitioning back to  the breast may take some time.  LC recommended trying to feed baby and appetizer of EBM and then latch, if not latch feed the whole volume for the feeding and see if she will latch. After feeding post pump both breast to establish and protect milk supply.  LC yesterday placed a request for the [facility] to call mom to set up and appt. Mom mentioned it and is aware she will receive a call.  LC reviewed BF D/C teaching and reminded mom if the baby doesn't latch she can increase the volume of milk per feeding.  Mom has the number for LC O/P.   Feeding Mother's Current Feeding Choice: Breast Milk Nipple Type: Nfant Standard Flow (white)  LATCH Score    Lactation Tools Discussed/Used Tools: Pump;Flanges Flange Size: 24 Breast pump type: Double-Electric Breast Pump Pump Education: Milk Storage Pumping frequency: per mom has pumped x 10 in the last 24 hours Pumped volume: 30 mL  Interventions Interventions: Breast feeding basics reviewed;DEBP;Education;Pace feeding;LC Services brochure  Discharge Discharge Education: Engorgement and breast care;Warning signs for feeding baby;Outpatient recommendation;Outpatient Epic message sent;Other (comment) (Previous LC sent the referraL 03/22/2022) Pump: DEBP;Personal (per mom Spectra) WIC Program: No  Consult Status Consult Status: Complete Date: 03/23/22    Myer Haff 03/23/2022, 10:28 AM

## 2022-03-23 NOTE — Discharge Summary (Signed)
Postpartum Discharge Summary  Date of Service updated 03/23/22    Patient Name: [name] [name] DOB: 23-Apr-1990 MRN: [id number]  Date of admission: 03/20/2022 Delivery date:03/20/2022  Delivering provider: Everett Graff  Date of discharge: 03/23/2022  Admitting diagnosis: Status post repeat low transverse cesarean section [Z98.891] Intrauterine pregnancy: [redacted]w[redacted]d    Secondary diagnosis:  Principal Problem:   Status post repeat low transverse cesarean section Active Problems:   History of 2 cesarean sections   Positive GBS test   LGA (large for gestational age) fetus affecting management of mother   History of miscarriage x 2   Penicillin allergy   PPH (postpartum hemorrhage)  Additional problems: none    Discharge diagnosis: Term Pregnancy Delivered                                              Post partum procedures: TXA x2 doses, Cytotec 800 mcg, methergine series x 24 hours Augmentation: N/A Complications: [id number] Hospital course: Scheduled C/S   32y.o. yo [id number] admitted to the hospital 03/20/2022 for scheduled cesarean section with the following indication:Elective Repeat.Delivery details are as follows:  Membrane Rupture Time/Date: 11:00 AM ,03/20/2022   Delivery Method:C-Section, Vacuum Assisted  Details of operation can be found in separate operative note.  Patient had a postpartum course complicated by postpartum hemorrhage.  She is ambulating, tolerating a regular diet, passing flatus, and urinating well. Patient is discharged home in stable condition on  03/23/22        Newborn Data: Birth date:03/20/2022  Birth time:11:01 AM  Gender:Female  Living status:Living  Apgars:8 ,9  Weight:4040 g     Magnesium Sulfate received: No BMZ received: No Rhophylac:N/A MMR:N/A Transfusion:No  Physical exam  Vitals:   03/22/22 0537 03/22/22 1601 03/22/22 2041 03/23/22 0540  BP: 117/62 117/61 120/69 130/69  Pulse: 77 86 72 75  Resp: '18 18  18 18  '$ Temp: 98.1 F (36.7 C) 98.4 F (36.9 C) 97.9 F (36.6 C) 97.9 F (36.6 C)  TempSrc: Oral Axillary Oral Oral  SpO2: 100%   98%  Weight:      Height:       General: alert, cooperative, and no distress Lochia: appropriate Uterine Fundus: firm Incision: Dressing is clean, dry, and intact DVT Evaluation: No evidence of DVT seen on physical exam. No cords or calf tenderness. No significant calf/ankle edema. Labs: Lab Results  Component Value Date   WBC 13.8 (H) 03/21/2022   HGB 11.5 (L) 03/21/2022   HCT 32.6 (L) 03/21/2022   MCV 91.1 03/21/2022   PLT 157 03/21/2022      Latest Ref Rng & Units 03/18/2022    9:43 AM  CMP  Glucose 70 - 99 mg/dL 160   BUN 6 - 20 mg/dL 8   Creatinine 0.44 - 1.00 mg/dL 0.64   Sodium 135 - 145 mmol/L 134   Potassium 3.5 - 5.1 mmol/L 4.0   Chloride 98 - 111 mmol/L 103   CO2 22 - 32 mmol/L 21   Calcium 8.9 - 10.3 mg/dL 9.2    Edinburgh Score:    03/21/2022    7:00 PM  Edinburgh Postnatal Depression Scale Screening Tool  I have been able to laugh and see the funny side of things. 0  I have looked forward with enjoyment to things. 0  I have blamed  myself unnecessarily when things went wrong. 1  I have been anxious or worried for no good reason. 0  I have felt scared or panicky for no good reason. 0  Things have been getting on top of me. 1  I have been so unhappy that I have had difficulty sleeping. 1  I have felt sad or miserable. 1  I have been so unhappy that I have been crying. 1  The thought of harming myself has occurred to me. 1  Edinburgh Postnatal Depression Scale Total 6      After visit meds:  Allergies as of 03/23/2022       Reactions   Penicillins Hives   Citrus Rash        Medication List     STOP taking these medications    acetaminophen 500 MG tablet Commonly known as: TYLENOL   FISH OIL PO   prenatal multivitamin Tabs tablet   Vitamin D 50 MCG (2000 UT) tablet       TAKE these medications     ibuprofen 600 MG tablet Commonly known as: ADVIL Take 1 tablet (600 mg total) by mouth every 6 (six) hours.   oxyCODONE 5 MG immediate release tablet Commonly known as: Oxy IR/ROXICODONE Take 1-2 tablets (5-10 mg total) by mouth every 4 (four) hours as needed for moderate pain.   senna-docusate 8.6-50 MG tablet Commonly known as: Senokot-S Take 2 tablets by mouth daily. Start taking on: March 24, 2022               Discharge Care Instructions  (From admission, onward)           Start     Ordered   03/23/22 0000  Discharge wound care:       Comments: Take dressing off on Thursday, January 18, remove it sooner if it is dirty or damaged. Clean area with soap and water and pat dry. You can leave the steri strips on until they fall off or take them off gently by Saturday, Jan 17. Call the office for increased drainage, redness, pain, or warmth. Keep the incision area clean and dry at all times.   03/23/22 1227             Discharge home in stable condition Infant Feeding: Breast and formula Infant Disposition:home with mother Discharge instruction: per After Visit Summary and Postpartum booklet. Activity: Advance as tolerated. Pelvic rest for 6 weeks.  Diet: routine diet Anticipated Birth Control:  sterilization during cesarean section Postpartum Appointment:6 weeks Additional Postpartum F/U:  none Future Appointments:No future appointments. Follow up Visit:  Follow-up Information     Everett Graff, MD. Schedule an appointment as soon as possible for a visit in 6 week(s).   Specialty: Obstetrics and Gynecology Contact information: [address] [place] [address] [phone]                     03/23/2022 Arrie Eastern, CNM

## 2022-03-26 ENCOUNTER — Other Ambulatory Visit: Payer: Self-pay

## 2022-03-26 ENCOUNTER — Inpatient Hospital Stay (HOSPITAL_COMMUNITY)
Admission: AD | Admit: 2022-03-26 | Discharge: 2022-03-28 | DRG: 776 | Disposition: A | Discharge status: Home / Self Care | Payer: Medicaid Other | Attending: Obstetrics and Gynecology | Admitting: Obstetrics and Gynecology

## 2022-03-26 ENCOUNTER — Encounter (HOSPITAL_COMMUNITY): Payer: Self-pay | Admitting: Obstetrics & Gynecology

## 2022-03-26 DIAGNOSIS — K219 Gastro-esophageal reflux disease without esophagitis: Secondary | ICD-10-CM | POA: Diagnosis present

## 2022-03-26 DIAGNOSIS — O1495 Unspecified pre-eclampsia, complicating the puerperium: Principal | ICD-10-CM | POA: Diagnosis present

## 2022-03-26 DIAGNOSIS — O9963 Diseases of the digestive system complicating the puerperium: Secondary | ICD-10-CM | POA: Diagnosis present

## 2022-03-26 DIAGNOSIS — O9081 Anemia of the puerperium: Secondary | ICD-10-CM | POA: Diagnosis present

## 2022-03-26 DIAGNOSIS — O1415 Severe pre-eclampsia, complicating the puerperium: Principal | ICD-10-CM | POA: Diagnosis present

## 2022-03-26 DIAGNOSIS — Z98891 History of uterine scar from previous surgery: Secondary | ICD-10-CM

## 2022-03-26 LAB — LACTATE DEHYDROGENASE: LDH: 180 U/L (ref 98–192)

## 2022-03-26 LAB — CBC
HCT: 30.6 % — ABNORMAL LOW (ref 36.0–46.0)
Hemoglobin: 10.1 g/dL — ABNORMAL LOW (ref 12.0–15.0)
MCH: 31.6 pg (ref 26.0–34.0)
MCHC: 33 g/dL (ref 30.0–36.0)
MCV: 95.6 fL (ref 80.0–100.0)
Platelets: 218 10*3/uL (ref 150–400)
RBC: 3.2 MIL/uL — ABNORMAL LOW (ref 3.87–5.11)
RDW: 12.9 % (ref 11.5–15.5)
WBC: 7.6 10*3/uL (ref 4.0–10.5)
nRBC: 0 % (ref 0.0–0.2)

## 2022-03-26 LAB — COMPREHENSIVE METABOLIC PANEL
ALT: 16 U/L (ref 0–44)
AST: 23 U/L (ref 15–41)
Albumin: 3 g/dL — ABNORMAL LOW (ref 3.5–5.0)
Alkaline Phosphatase: 88 U/L (ref 38–126)
Anion gap: 9 (ref 5–15)
BUN: 9 mg/dL (ref 6–20)
CO2: 25 mmol/L (ref 22–32)
Calcium: 8.6 mg/dL — ABNORMAL LOW (ref 8.9–10.3)
Chloride: 103 mmol/L (ref 98–111)
Creatinine, Ser: 0.7 mg/dL (ref 0.44–1.00)
GFR, Estimated: >60 mL/min (ref >60)
Glucose, Bld: 104 mg/dL — ABNORMAL HIGH (ref 70–99)
Potassium: 3.8 mmol/L (ref 3.5–5.1)
Sodium: 137 mmol/L (ref 135–145)
Total Bilirubin: 0.7 mg/dL (ref 0.3–1.2)
Total Protein: 5.7 g/dL — ABNORMAL LOW (ref 6.5–8.1)

## 2022-03-26 LAB — TROPONIN I (HIGH SENSITIVITY): Troponin I (High Sensitivity): 6 ng/L (ref <18)

## 2022-03-26 MED ORDER — LACTATED RINGERS IV SOLN: INTRAVENOUS | Status: DC

## 2022-03-26 MED ORDER — ALUM & MAG HYDROXIDE-SIMETH 200-200-20 MG/5ML PO SUSP
30.0000 mL | Freq: Four times a day (QID) | ORAL | Status: DC | PRN
  Administered 2022-03-27: 30 mL via ORAL
  Filled 2022-03-26: qty 30

## 2022-03-26 MED ORDER — MAGNESIUM SULFATE BOLUS VIA INFUSION
4.0000 g | Freq: Once | INTRAVENOUS | Status: AC
  Administered 2022-03-26: 4 g via INTRAVENOUS
  Filled 2022-03-26: qty 1000

## 2022-03-26 MED ORDER — ACETAMINOPHEN 500 MG PO TABS
500.0000 mg | ORAL_TABLET | Freq: Four times a day (QID) | ORAL | Status: DC | PRN
  Administered 2022-03-28: 500 mg via ORAL
  Filled 2022-03-26: qty 1

## 2022-03-26 MED ORDER — HYDRALAZINE HCL 20 MG/ML IJ SOLN: 10.0000 mg | INTRAMUSCULAR | Status: DC | PRN

## 2022-03-26 MED ORDER — MAGNESIUM SULFATE 40 GM/1000ML IV SOLN
2.0000 g/h | INTRAVENOUS | Status: AC
  Administered 2022-03-27: 2 g/h via INTRAVENOUS
  Filled 2022-03-26 (×2): qty 1000

## 2022-03-26 MED ORDER — ONDANSETRON 4 MG PO TBDP
4.0000 mg | ORAL_TABLET | Freq: Once | ORAL | Status: AC
  Administered 2022-03-26: 4 mg via ORAL
  Filled 2022-03-26: qty 1

## 2022-03-26 MED ORDER — LABETALOL HCL 5 MG/ML IV SOLN: 40.0000 mg | INTRAVENOUS | Status: DC | PRN

## 2022-03-26 MED ORDER — LABETALOL HCL 5 MG/ML IV SOLN: 80.0000 mg | INTRAVENOUS | Status: DC | PRN

## 2022-03-26 MED ORDER — LABETALOL HCL 5 MG/ML IV SOLN
20.0000 mg | INTRAVENOUS | Status: DC | PRN
  Administered 2022-03-26: 20 mg via INTRAVENOUS
  Filled 2022-03-26: qty 4

## 2022-03-26 MED ORDER — LABETALOL HCL 5 MG/ML IV SOLN
40.0000 mg | INTRAVENOUS | Status: DC | PRN
  Filled 2022-03-26 (×2): qty 8

## 2022-03-26 MED ORDER — LABETALOL HCL 5 MG/ML IV SOLN: 20.0000 mg | INTRAVENOUS | Status: DC | PRN

## 2022-03-26 MED ORDER — ALUM & MAG HYDROXIDE-SIMETH 200-200-20 MG/5ML PO SUSP
30.0000 mL | Freq: Once | ORAL | Status: AC
  Administered 2022-03-26: 30 mL via ORAL
  Filled 2022-03-26: qty 30

## 2022-03-26 MED ORDER — OXYCODONE HCL 5 MG PO TABS: 5.0000 mg | ORAL_TABLET | ORAL | Status: DC | PRN

## 2022-03-26 MED ORDER — IBUPROFEN 600 MG PO TABS
600.0000 mg | ORAL_TABLET | Freq: Four times a day (QID) | ORAL | Status: DC
  Administered 2022-03-26 – 2022-03-28 (×7): 600 mg via ORAL
  Filled 2022-03-26 (×7): qty 1

## 2022-03-26 MED ORDER — NIFEDIPINE ER OSMOTIC RELEASE 30 MG PO TB24
30.0000 mg | ORAL_TABLET | Freq: Once | ORAL | Status: AC
  Administered 2022-03-26: 30 mg via ORAL
  Filled 2022-03-26: qty 1

## 2022-03-26 MED ORDER — SENNOSIDES-DOCUSATE SODIUM 8.6-50 MG PO TABS
2.0000 | ORAL_TABLET | Freq: Every day | ORAL | Status: DC
  Administered 2022-03-27: 2 via ORAL
  Filled 2022-03-26 (×2): qty 2

## 2022-03-26 NOTE — H&P (Signed)
[name] [name] is a 32 y.o. female [phone] POD #6 form repeat cesarean section #3 with tubal sterilization on 03/20/2022. Postpartum course complicated by postpartum hemorrhage with manual uterine evacuation of blood clots. She is admitted for postpartum pre-eclampsia with severe features being severe range blood pressures and chest/epigastric pain. She reports chest pain starts in the middle of her chest and radiates into ribs and upper back. Pain is intermittent, currently rating 3/10. Nothing makes the pain better. She tried taking Ibuprofen and Tylenol at 1130am which did not relieve symptoms. She reports sometimes that the pain worsens whenever she gets hungry, however is often not able to eat because of the nausea. She denies headache, blurry vision, shortness of breath or RUQ pain. [place] labs wnl, troponin wnl. EGK wnl and reviewed by cardiologist.  OB History     Gravida  6   Para  3   Term  3   Preterm      AB  3   Living  3      SAB  2   IAB  1   Ectopic      Multiple  0   Live Births  3          2007--SAB 2008--TAB 2009--primary C/S at 66 weeks, female, 9+8, FTP, delivered in [place] 2012--SAB 2013--Repeat LTCS due to FTD/failed TOL, female, 9 lbs, [facility], Dr. Elly Modena.  Past Medical History:  Diagnosis Date   Abnormal Pap smear 03/09/2010   Genital warts    Past Surgical History:  Procedure Laterality Date   CESAREAN SECTION  2009   CESAREAN SECTION  08/12/2011   Procedure: CESAREAN SECTION;  Surgeon: Mora Bellman, MD;  Location: [facility];  Service: Gynecology;  Laterality: N/A;   CESAREAN SECTION N/A 03/20/2022   Procedure: REPEAT CESAREAN SECTION;  Surgeon: Everett Graff, MD;  Location: [facility];  Service: Obstetrics;  Laterality: N/A;   DILATION AND CURETTAGE OF UTERUS  2007   TUBAL LIGATION Bilateral 03/20/2022   Procedure: BILATERAL TUBAL LIGATION;  Surgeon: Everett Graff, MD;  Location: [facility];  Service: Obstetrics;  Laterality:  Bilateral;  Ligasure used on both tubes   Family History: family history includes Colon cancer in her mother; Heart disease in her brother and mother; Parkinsonism in her brother. Social History:  reports that she has never smoked. She has never used smokeless tobacco. She reports current alcohol use of about 1.0 standard drink of alcohol per week. She reports that she does not use drugs.     Maternal Diabetes: No Maternal Substance Abuse:  No Significant Maternal Medications:  None Significant Maternal Lab Results:  Group B Strep positive Number of Prenatal Visits:greater than 3 verified prenatal visits Other Comments:  None  Review of Systems  Constitutional: Negative.   HENT: Negative.    Eyes: Negative.   Respiratory: Negative.    Cardiovascular:  Positive for chest pain.  Gastrointestinal:  Positive for nausea. Negative for abdominal pain.  Endocrine: Negative.   Genitourinary: Negative.   Musculoskeletal:  Positive for back pain.   Maternal Medical History:  Reason for admission: Nausea.      Blood pressure (!) 164/85, pulse 64, temperature 98.6 F (37 C), temperature source Oral, resp. rate 18, height '5\' 5"'$  (1.651 m), weight 100.2 kg, last menstrual period 06/20/2021, SpO2 100 %, unknown if currently breastfeeding. Exam Physical Exam Vitals reviewed.  Constitutional:      Appearance: Normal appearance.  Cardiovascular:     Rate and Rhythm: Normal rate  and regular rhythm.  Pulmonary:     Effort: Pulmonary effort is normal. No respiratory distress.     Breath sounds: No stridor. No wheezing or rales.  Abdominal:     General: Bowel sounds are normal. There is no distension.     Palpations: Abdomen is soft.     Tenderness: There is no abdominal tenderness.  Skin:    General: Skin is warm and dry.  Neurological:     General: No focal deficit present.     Mental Status: She is alert and oriented to person, place, and time.  Psychiatric:        Mood and Affect:  Mood normal.     Prenatal labs: ABO, Rh: --/--/B POS Performed at [facility], [place] [address], [place], [place] [address]  [phone]) Antibody: NEG (01/10 0944) Rubella: Nonimmune (06/27 0000) RPR: NON REACTIVE (01/10 0943)  HBsAg: Negative (06/27 0000)  HIV: Non-reactive (06/27 0000)  GBS:  Positive  Results for orders placed or performed during the hospital encounter of 03/26/22 (from the past 24 hour(s))  CBC     Status: Abnormal   Collection Time: 03/26/22  3:43 PM  Result Value Ref Range   WBC 7.6 4.0 - 10.5 K/uL   RBC 3.20 (L) 3.87 - 5.11 MIL/uL   Hemoglobin 10.1 (L) 12.0 - 15.0 g/dL   HCT 30.6 (L) 36.0 - 46.0 %   MCV 95.6 80.0 - 100.0 fL   MCH 31.6 26.0 - 34.0 pg   MCHC 33.0 30.0 - 36.0 g/dL   RDW 12.9 11.5 - 15.5 %   Platelets 218 150 - 400 K/uL   nRBC 0.0 0.0 - 0.2 %  Comprehensive metabolic panel     Status: Abnormal   Collection Time: 03/26/22  3:43 PM  Result Value Ref Range   Sodium 137 135 - 145 mmol/L   Potassium 3.8 3.5 - 5.1 mmol/L   Chloride 103 98 - 111 mmol/L   CO2 25 22 - 32 mmol/L   Glucose, Bld 104 (H) 70 - 99 mg/dL   BUN 9 6 - 20 mg/dL   Creatinine, Ser 0.70 0.44 - 1.00 mg/dL   Calcium 8.6 (L) 8.9 - 10.3 mg/dL   Total Protein 5.7 (L) 6.5 - 8.1 g/dL   Albumin 3.0 (L) 3.5 - 5.0 g/dL   AST 23 15 - 41 U/L   ALT 16 0 - 44 U/L   Alkaline Phosphatase 88 38 - 126 U/L   Total Bilirubin 0.7 0.3 - 1.2 mg/dL   GFR, Estimated >60 >60 mL/min   Anion gap 9 5 - 15  Troponin I (High Sensitivity)     Status: None   Collection Time: 03/26/22  3:50 PM  Result Value Ref Range   Troponin I (High Sensitivity) 6 <18 ng/L    Assessment/Plan: 32 y.o. female [id number] POD #6 form repeat cesarean section with tubal sterilization on 03/20/2022 Pre-eclampsia with severe features H/o c-section x3  Admit to [facility] specialty care Start magnesium sulfate for seizure prophylaxis for 24 hr IV antihypertensive PRN severe range BP Start procardia 30 mg XL qd Maalox  for GERD type symptoms Continue oral analgesia (tylenol, motrin, oxycodone PRN) Breast pump Monitor VS and urine output closely PIH labs daily SCDs while in bed  [name] Eiben D Easter Schinke 03/26/2022, 5:30 PM

## 2022-03-26 NOTE — Plan of Care (Signed)
  Problem: Education: Goal: Knowledge of General Education information will improve Description: Including pain rating scale, medication(s)/side effects and non-pharmacologic comfort measures Outcome: Completed/Met

## 2022-03-26 NOTE — MAU Provider Note (Signed)
History     CSN: [id number]  Arrival date and time: 03/26/22 1437   None    Chief Complaint  Patient presents with   Back Pain   Chest Pain   Nausea   HPI [name] [name] is a 32 y.o. [id number] s/p rLTCS on 03/20/2022 who presents to MAU for chest pain, nausea, and back pain. Patient reports chest pain, back pain, and nausea all started on the day that she was discharged. She reports chest pain starts in the middle of her chest and radiates into ribs and upper back. Pain is intermittent, currently rating 3/10. Nothing makes the pain better. She tried taking Ibuprofen and Tylenol at 1130am which did not relieve symptoms. She reports sometimes that the pain worsens whenever she gets hungry, however is often not able to eat because of the nausea. She occasionally feels short of breath when the pain comes on. She denies palpitations, headache or vision changes. Has had mild swelling in ankles and feet. She denies any cardiac history.   Patient has been receiving care at [facility].    OB History     Gravida  6   Para  3   Term  3   Preterm      AB  3   Living  3      SAB  2   IAB  1   Ectopic      Multiple  0   Live Births  3           Past Medical History:  Diagnosis Date   Abnormal Pap smear 03/09/2010   Genital warts     Past Surgical History:  Procedure Laterality Date   CESAREAN SECTION  2009   CESAREAN SECTION  08/12/2011   Procedure: CESAREAN SECTION;  Surgeon: Mora Bellman, MD;  Location: [place] [facility];  Service: Gynecology;  Laterality: N/A;   CESAREAN SECTION N/A 03/20/2022   Procedure: REPEAT CESAREAN SECTION;  Surgeon: Everett Graff, MD;  Location: [place] [facility];  Service: Obstetrics;  Laterality: N/A;   DILATION AND CURETTAGE OF UTERUS  2007   TUBAL LIGATION Bilateral 03/20/2022   Procedure: BILATERAL TUBAL LIGATION;  Surgeon: Everett Graff, MD;  Location: [place] [facility];  Service: Obstetrics;  Laterality: Bilateral;  Ligasure used on both tubes   Family  History  Problem Relation Age of Onset   Heart disease Mother    Colon cancer Mother    Heart disease Brother        HOCM dx at 8 yo   Parkinsonism Brother    Social History   Tobacco Use   Smoking status: Never   Smokeless tobacco: Never  Vaping Use   Vaping Use: Never used  Substance Use Topics   Alcohol use: Yes    Alcohol/week: 1.0 standard drink of alcohol    Types: 1 Standard drinks or equivalent per week    Comment: socially   Drug use: No    Allergies:  Allergies  Allergen Reactions   Penicillins Hives   Citrus Rash    Medications Prior to Admission  Medication Sig Dispense Refill Last Dose   acetaminophen (TYLENOL) 500 MG tablet Take 500 mg by mouth every 6 (six) hours as needed for moderate pain.   03/26/2022   ibuprofen (ADVIL) 600 MG tablet Take 1 tablet (600 mg total) by mouth every 6 (six) hours. 30 tablet 0 03/26/2022   oxyCODONE (OXY IR/ROXICODONE) 5 MG immediate release tablet Take 1-2 tablets (5-10 mg total) by mouth every  4 (four) hours as needed for moderate pain. 30 tablet 0 Past Week   senna-docusate (SENOKOT-S) 8.6-50 MG tablet Take 2 tablets by mouth daily. 60 tablet 0 Past Week   Review of Systems  Constitutional: Negative.   Respiratory: Negative.    Cardiovascular:  Positive for chest pain.  Gastrointestinal:  Positive for nausea.  Genitourinary: Negative.   Musculoskeletal:  Positive for back pain.  Neurological: Negative.    Physical Exam  Patient Vitals for the past 24 hrs:  BP Temp Temp src Pulse Resp SpO2 Height Weight  03/26/22 1716 (!) 164/85 -- -- 64 -- -- -- --  03/26/22 1701 (!) 153/84 -- -- 68 -- -- -- --  03/26/22 1646 (!) 164/86 -- -- 71 -- -- -- --  03/26/22 1630 (!) 158/88 -- -- 75 -- -- -- --  03/26/22 1620 (!) 156/76 -- -- 70 -- -- -- --  03/26/22 1601 (!) 155/80 -- -- 77 -- -- -- --  03/26/22 1546 (!) 152/83 -- -- 75 -- -- -- --  03/26/22 1542 (!) 153/84 -- -- 73 18 -- -- --  03/26/22 1529 (!) 154/87 98.6 F (37 C)  Oral 82 20 100 % -- --  03/26/22 1521 -- -- -- -- -- -- '5\' 5"'$  (1.651 m) 100.2 kg   Physical Exam Vitals and nursing note reviewed.  Constitutional:      General: She is not in acute distress.    Appearance: She is not ill-appearing.  Eyes:     Extraocular Movements: Extraocular movements intact.     Pupils: Pupils are equal, round, and reactive to light.  Cardiovascular:     Rate and Rhythm: Normal rate and regular rhythm.     Heart sounds: Normal heart sounds. No murmur heard. Pulmonary:     Effort: Pulmonary effort is normal. No respiratory distress.     Breath sounds: Normal breath sounds. No stridor. No wheezing.  Abdominal:     Palpations: Abdomen is soft.     Tenderness: There is no abdominal tenderness.  Musculoskeletal:        General: Normal range of motion.     Cervical back: Normal range of motion.     Right lower leg: 1+ Edema present.     Left lower leg: 1+ Edema present.  Skin:    General: Skin is warm and dry.  Neurological:     General: No focal deficit present.     Mental Status: She is alert and oriented to person, place, and time.  Psychiatric:        Mood and Affect: Mood normal.        Behavior: Behavior normal.    Results for orders placed or performed during the hospital encounter of 03/26/22 (from the past 24 hour(s))  CBC     Status: Abnormal   Collection Time: 03/26/22  3:43 PM  Result Value Ref Range   WBC 7.6 4.0 - 10.5 K/uL   RBC 3.20 (L) 3.87 - 5.11 MIL/uL   Hemoglobin 10.1 (L) 12.0 - 15.0 g/dL   HCT 30.6 (L) 36.0 - 46.0 %   MCV 95.6 80.0 - 100.0 fL   MCH 31.6 26.0 - 34.0 pg   MCHC 33.0 30.0 - 36.0 g/dL   RDW 12.9 11.5 - 15.5 %   Platelets 218 150 - 400 K/uL   nRBC 0.0 0.0 - 0.2 %  Comprehensive metabolic panel     Status: Abnormal   Collection Time: 03/26/22  3:43 PM  Result  Value Ref Range   Sodium 137 135 - 145 mmol/L   Potassium 3.8 3.5 - 5.1 mmol/L   Chloride 103 98 - 111 mmol/L   CO2 25 22 - 32 mmol/L   Glucose, Bld 104 (H)  70 - 99 mg/dL   BUN 9 6 - 20 mg/dL   Creatinine, Ser 0.70 0.44 - 1.00 mg/dL   Calcium 8.6 (L) 8.9 - 10.3 mg/dL   Total Protein 5.7 (L) 6.5 - 8.1 g/dL   Albumin 3.0 (L) 3.5 - 5.0 g/dL   AST 23 15 - 41 U/L   ALT 16 0 - 44 U/L   Alkaline Phosphatase 88 38 - 126 U/L   Total Bilirubin 0.7 0.3 - 1.2 mg/dL   GFR, Estimated >60 >60 mL/min   Anion gap 9 5 - 15  Troponin I (High Sensitivity)     Status: None   Collection Time: 03/26/22  3:50 PM  Result Value Ref Range   Troponin I (High Sensitivity) 6 <18 ng/L    MAU Course  Procedures  MDM CBC, CMP, Troponin EKG, reviewed with Drs. Mercado-Ortiz and Ernestina Patches. Dr. Clement Husbands did consult cardiology who reviewed EKG given possible concern for delta wave. Cardiology reviewed EKG as normal.   Labs reassuring. Troponin negative. Patient mildly anemic with Hgb 10.1. CMP unremarkable. BP's consistently elevated in the 150s/80s. Patient did have severe range BP at 164/86. During this BP, patient reported chest pain had increased from 3/10 to 6/10. Repeat BP 153/84. GI cocktail and Zofran ODT ordered. Patient did have second severe range BP. Labetalol protocol initiated.   D/w Dr. Ivin Poot patient's presentation, physical exam and findings. Will admit patient to [facility] for postpartum pre-eclampsia. Dr. Ivin Poot to place orders.  Assessment and Plan  Postpartum  Pre-eclampsia  - Admit to [facility] - Will initiate mag sulfate - admission orders placed by Dr. Catha Nottingham, CNM 03/26/2022, 5:30 PM

## 2022-03-26 NOTE — MAU Note (Signed)
[name] [name] is a 32 y.o. at Unknown here in MAU reporting: nausea, chest and back pain.  Reports nausea began prior to discharge from hospital on 03/23/2022.  States after discharge chest pain and back pain began.  Reports chest pain is intermittent and began in the center then radiates out into her ribs.  Reports the back pain is in the upper back, it's dull and intermittent.  Reports Ibuprofen & Tylenol haven't relieved chest or back pain. S/P Cesarean Section on 03/20/2022 LMP: NA Onset of complaint: 03/23/2022 Pain score: 3 chest & 5 back Vitals:   03/26/22 1529  BP: (!) 154/87  Pulse: 82  Resp: 20  Temp: 98.6 F (37 C)  SpO2: 100%     FHT:NA Lab orders placed from triage:   None

## 2022-03-27 LAB — CBC
HCT: 34.6 % — ABNORMAL LOW (ref 36.0–46.0)
Hemoglobin: 11.6 g/dL — ABNORMAL LOW (ref 12.0–15.0)
MCH: 32 pg (ref 26.0–34.0)
MCHC: 33.5 g/dL (ref 30.0–36.0)
MCV: 95.6 fL (ref 80.0–100.0)
Platelets: 242 10*3/uL (ref 150–400)
RBC: 3.62 MIL/uL — ABNORMAL LOW (ref 3.87–5.11)
RDW: 13.1 % (ref 11.5–15.5)
WBC: 8.4 10*3/uL (ref 4.0–10.5)
nRBC: 0 % (ref 0.0–0.2)

## 2022-03-27 LAB — COMPREHENSIVE METABOLIC PANEL
ALT: 20 U/L (ref 0–44)
AST: 25 U/L (ref 15–41)
Albumin: 3.2 g/dL — ABNORMAL LOW (ref 3.5–5.0)
Alkaline Phosphatase: 98 U/L (ref 38–126)
Anion gap: 10 (ref 5–15)
BUN: 5 mg/dL — ABNORMAL LOW (ref 6–20)
CO2: 26 mmol/L (ref 22–32)
Calcium: 8 mg/dL — ABNORMAL LOW (ref 8.9–10.3)
Chloride: 103 mmol/L (ref 98–111)
Creatinine, Ser: 0.73 mg/dL (ref 0.44–1.00)
GFR, Estimated: >60 mL/min (ref >60)
Glucose, Bld: 109 mg/dL — ABNORMAL HIGH (ref 70–99)
Potassium: 3.8 mmol/L (ref 3.5–5.1)
Sodium: 139 mmol/L (ref 135–145)
Total Bilirubin: 0.4 mg/dL (ref 0.3–1.2)
Total Protein: 6.3 g/dL — ABNORMAL LOW (ref 6.5–8.1)

## 2022-03-27 LAB — LACTATE DEHYDROGENASE: LDH: 192 U/L (ref 98–192)

## 2022-03-27 MED ORDER — NIFEDIPINE ER OSMOTIC RELEASE 30 MG PO TB24
30.0000 mg | ORAL_TABLET | Freq: Once | ORAL | Status: AC
  Administered 2022-03-28: 30 mg via ORAL
  Filled 2022-03-27: qty 1

## 2022-03-27 MED ORDER — BUTALBITAL-APAP-CAFFEINE 50-325-40 MG PO TABS
1.0000 | ORAL_TABLET | Freq: Four times a day (QID) | ORAL | Status: DC | PRN
  Administered 2022-03-27: 1 via ORAL
  Filled 2022-03-27: qty 1

## 2022-03-27 NOTE — Plan of Care (Signed)
  Problem: Education: Goal: Knowledge of the prescribed therapeutic regimen will improve Outcome: Progressing Goal: Understanding of sexual limitations or changes related to disease process or condition will improve Outcome: Progressing Goal: Individualized Educational Video(s) Outcome: Progressing   Problem: Self-Concept: Goal: Communication of feelings regarding changes in body function or appearance will improve Outcome: Progressing   Problem: Skin Integrity: Goal: Demonstration of wound healing without infection will improve Outcome: Progressing   Problem: Education: Goal: Knowledge of condition will improve Outcome: Progressing Goal: Individualized Educational Video(s) Outcome: Progressing Goal: Individualized Newborn Educational Video(s) Outcome: Progressing   Problem: Activity: Goal: Will verbalize the importance of balancing activity with adequate rest periods Outcome: Progressing Goal: Ability to tolerate increased activity will improve Outcome: Progressing   Problem: Coping: Goal: Ability to identify and utilize available resources and services will improve Outcome: Progressing   Problem: Life Cycle: Goal: Chance of risk for complications during the postpartum period will decrease Outcome: Progressing   Problem: Role Relationship: Goal: Ability to demonstrate positive interaction with newborn will improve Outcome: Progressing   Problem: Skin Integrity: Goal: Demonstration of wound healing without infection will improve Outcome: Progressing   Problem: Education: Goal: Knowledge of disease or condition will improve Outcome: Progressing Goal: Knowledge of the prescribed therapeutic regimen will improve Outcome: Progressing   Problem: Fluid Volume: Goal: Peripheral tissue perfusion will improve Outcome: Progressing   Problem: Clinical Measurements: Goal: Complications related to disease process, condition or treatment will be avoided or  minimized Outcome: Progressing   Problem: Health Behavior/Discharge Planning: Goal: Ability to manage health-related needs will improve Outcome: Progressing   Problem: Clinical Measurements: Goal: Ability to maintain clinical measurements within normal limits will improve Outcome: Progressing Goal: Will remain free from infection Outcome: Progressing Goal: Diagnostic test results will improve Outcome: Progressing Goal: Respiratory complications will improve Outcome: Progressing Goal: Cardiovascular complication will be avoided Outcome: Progressing   Problem: Activity: Goal: Risk for activity intolerance will decrease Outcome: Progressing   Problem: Nutrition: Goal: Adequate nutrition will be maintained Outcome: Progressing   Problem: Coping: Goal: Level of anxiety will decrease Outcome: Progressing   Problem: Elimination: Goal: Will not experience complications related to bowel motility Outcome: Progressing Goal: Will not experience complications related to urinary retention Outcome: Progressing   Problem: Pain Managment: Goal: General experience of comfort will improve Outcome: Progressing   Problem: Safety: Goal: Ability to remain free from injury will improve Outcome: Progressing   Problem: Skin Integrity: Goal: Risk for impaired skin integrity will decrease Outcome: Progressing

## 2022-03-27 NOTE — Progress Notes (Signed)
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 Pt was tearful and quite concerned. She states the pediatrician will call after 5 for a more detailed discussion. She has a lingering HA refractory to ibuprofen, denies visual changes and RUQ pain. Feels tired since magnesium has been infusing.  O:   VS: BP (!) 156/82 (BP Location: Right Arm)   Pulse 75   Temp 98.2 F (36.8 C) (Oral)   Resp 18   Ht '5\' 5"'$  (1.651 m)   Wt 100.2 kg   LMP 06/20/2021   SpO2 98%   BMI 36.76 kg/m   LABS:  Recent Labs    03/26/22 1543 03/27/22 0636  WBC 7.6 8.4  HGB 10.1* 11.6*  PLT 218 242                  I&O: Intake/Output      01/19 0701 01/20 0700   P.O. 1920   I.V. (mL/kg) 1377.1 (13.7)   Total Intake(mL/kg) 3297.1 (32.9)   Urine (mL/kg/hr) 4800 (3.5)   Total Output 4800   Net -1502.9         Physical Exam: Alert and oriented X3 Lungs: Clear and unlabored Heart: regular rate and rhythm / no mumurs Abdomen: soft, non-tender, non-distended  Fundus: firm, non-tender Incision: open to air, healing Lochia: scant rubra Extremities: trace edema, no calf pain, tenderness, or cords    A:  POD # 7 PP readmit for PP pre-eclampsia with severe features being severe range blood pressures and chest/epigastric pain.      -labile blood pressures   P:  Magnesium sulfate x 24 hrs, D/C today @ 1800 Continue routine postpartum orders Labetalol protocol Procardia XL 30 mg PO daily CBC and CMP daily    Plan reviewed w/ Dr. Jethro Bastos, DNP, CNM 03/27/2022, 8:30 PM

## 2022-03-28 ENCOUNTER — Telehealth (HOSPITAL_COMMUNITY): Payer: Self-pay

## 2022-03-28 LAB — CBC
HCT: 31.5 % — ABNORMAL LOW (ref 36.0–46.0)
Hemoglobin: 10.8 g/dL — ABNORMAL LOW (ref 12.0–15.0)
MCH: 32 pg (ref 26.0–34.0)
MCHC: 34.3 g/dL (ref 30.0–36.0)
MCV: 93.2 fL (ref 80.0–100.0)
Platelets: 241 10*3/uL (ref 150–400)
RBC: 3.38 MIL/uL — ABNORMAL LOW (ref 3.87–5.11)
RDW: 12.9 % (ref 11.5–15.5)
WBC: 6.7 10*3/uL (ref 4.0–10.5)
nRBC: 0 % (ref 0.0–0.2)

## 2022-03-28 LAB — COMPREHENSIVE METABOLIC PANEL
ALT: 16 U/L (ref 0–44)
AST: 18 U/L (ref 15–41)
Albumin: 2.9 g/dL — ABNORMAL LOW (ref 3.5–5.0)
Alkaline Phosphatase: 84 U/L (ref 38–126)
Anion gap: 9 (ref 5–15)
BUN: 11 mg/dL (ref 6–20)
CO2: 27 mmol/L (ref 22–32)
Calcium: 8 mg/dL — ABNORMAL LOW (ref 8.9–10.3)
Chloride: 102 mmol/L (ref 98–111)
Creatinine, Ser: 0.82 mg/dL (ref 0.44–1.00)
GFR, Estimated: >60 mL/min (ref >60)
Glucose, Bld: 121 mg/dL — ABNORMAL HIGH (ref 70–99)
Potassium: 4.1 mmol/L (ref 3.5–5.1)
Sodium: 138 mmol/L (ref 135–145)
Total Bilirubin: 0.4 mg/dL (ref 0.3–1.2)
Total Protein: 5.6 g/dL — ABNORMAL LOW (ref 6.5–8.1)

## 2022-03-28 LAB — LACTATE DEHYDROGENASE: LDH: 198 U/L — ABNORMAL HIGH (ref 98–192)

## 2022-03-28 MED ORDER — BUTALBITAL-APAP-CAFFEINE 50-325-40 MG PO TABS: 1.0000 | ORAL_TABLET | Freq: Four times a day (QID) | ORAL | 0 refills | Status: AC | PRN

## 2022-03-28 MED ORDER — NIFEDIPINE ER 30 MG PO TB24: 30.0000 mg | ORAL_TABLET | Freq: Once | ORAL | 0 refills | Status: DC

## 2022-03-28 NOTE — Discharge Summary (Signed)
Postpartum Discharge Summary  Date of Service updated 03/28/22    Patient Name: [name] [name] DOB: Aug 06, 1990 MRN: [id number]  Date of admission: 03/26/2022 Delivery date:This patient has no babies on file. Delivering provider: This patient has no babies on file. Date of discharge: 03/28/2022  Admitting diagnosis: Preeclampsia in postpartum period [O14.95] Intrauterine pregnancy: Unknown     Secondary diagnosis:  Principal Problem:   Preeclampsia in postpartum period   Discharge diagnosis:  pre-eclampsia in the postpartum period                                               Post partum procedures: none Hospital course:  [name] [name] is a 32 y.o. female [id number] POD #6 form repeat cesarean section #3 with tubal sterilization on 03/20/2022. Postpartum course complicated by postpartum hemorrhage with manual uterine evacuation of blood clots. She is admitted for postpartum pre-eclampsia with severe features being severe range blood pressures and chest/epigastric pain. She reports chest pain starts in the middle of her chest and radiates into ribs and upper back. Pain is intermittent, currently rating 3/10. Nothing makes the pain better. She tried taking Ibuprofen and Tylenol at 1130am which did not relieve symptoms. She reports sometimes that the pain worsens whenever she gets hungry, however is often not able to eat because of the nausea. She denies headache, blurry vision, shortness of breath or RUQ pain. [place] labs wnl, troponin wnl. EGK wnl and reviewed by cardiologist.    Magnesium Sulfate received: Yes: Seizure prophylaxis Transfusion:No  Physical exam  Vitals:   03/27/22 1925 03/27/22 1955 03/27/22 2359 03/28/22 0355  BP:  (!) 156/82 (!) 150/73 131/63  Pulse:  75 68 65  Resp:  '18 18 18  '$ Temp:  98.2 F (36.8 C) 98.3 F (36.8 C) 98 F (36.7 C)  TempSrc:  Oral Oral Oral  SpO2: 98% 98%  100%  Weight:      Height:       General: alert, cooperative, and no  distress Lochia: appropriate Uterine Fundus: firm Incision: healing DVT Evaluation: No evidence of DVT seen on physical exam. No cords or calf tenderness. No significant calf/ankle edema. Labs: Lab Results  Component Value Date   WBC 6.7 03/28/2022   HGB 10.8 (L) 03/28/2022   HCT 31.5 (L) 03/28/2022   MCV 93.2 03/28/2022   PLT 241 03/28/2022      Latest Ref Rng & Units 03/28/2022    3:50 AM  CMP  Glucose 70 - 99 mg/dL 121   BUN 6 - 20 mg/dL 11   Creatinine 0.44 - 1.00 mg/dL 0.82   Sodium 135 - 145 mmol/L 138   Potassium 3.5 - 5.1 mmol/L 4.1   Chloride 98 - 111 mmol/L 102   CO2 22 - 32 mmol/L 27   Calcium 8.9 - 10.3 mg/dL 8.0   Total Protein 6.5 - 8.1 g/dL 5.6   Total Bilirubin 0.3 - 1.2 mg/dL 0.4   Alkaline Phos 38 - 126 U/L 84   AST 15 - 41 U/L 18   ALT 0 - 44 U/L 16    Edinburgh Score:    03/21/2022    7:00 PM  Edinburgh Postnatal Depression Scale Screening Tool  I have been able to laugh and see the funny side of things. 0  I have looked forward with enjoyment to things. 0  I have blamed myself unnecessarily when things went wrong. 1  I have been anxious or worried for no good reason. 0  I have felt scared or panicky for no good reason. 0  Things have been getting on top of me. 1  I have been so unhappy that I have had difficulty sleeping. 1  I have felt sad or miserable. 1  I have been so unhappy that I have been crying. 1  The thought of harming myself has occurred to me. 1  Edinburgh Postnatal Depression Scale Total 6      After visit meds:  Allergies as of 03/28/2022       Reactions   Penicillins Hives   Citrus Rash        Medication List     TAKE these medications    acetaminophen 500 MG tablet Commonly known as: TYLENOL Take 500 mg by mouth every 6 (six) hours as needed for moderate pain.   butalbital-acetaminophen-caffeine 50-325-40 MG tablet Commonly known as: FIORICET Take 1 tablet by mouth every 6 (six) hours as needed for up to 7  days for headache.   ibuprofen 600 MG tablet Commonly known as: ADVIL Take 1 tablet (600 mg total) by mouth every 6 (six) hours.   NIFEdipine 30 MG 24 hr tablet Commonly known as: ADALAT CC Take 1 tablet (30 mg total) by mouth once for 1 dose.   oxyCODONE 5 MG immediate release tablet Commonly known as: Oxy IR/ROXICODONE Take 1-2 tablets (5-10 mg total) by mouth every 4 (four) hours as needed for moderate pain.   senna-docusate 8.6-50 MG tablet Commonly known as: Senokot-S Take 2 tablets by mouth daily.         Discharge home in stable condition Discharge instruction: per After Visit Summary and Postpartum booklet. Activity: Advance as tolerated. Pelvic rest for 6 weeks.  Diet: low salt diet Postpartum Appointment:6 weeks Additional Postpartum F/U: BP check 2-3 days Future Appointments:No future appointments. Follow up Visit:  [facility]. Schedule an appointment as soon as possible for a visit.   Specialty: Obstetrics and Gynecology Why: You will receive a call from [facility] to return to the office for a blood pressure check for early next week. Contact information: [address] [place] [address] [phone]                    03/28/2022 Arrie Eastern, CNM

## 2022-03-28 NOTE — Telephone Encounter (Signed)
Patient states she is currently inpatient at [facility]. Will attempt discharge follow up phone call at a later date.   Samantha Crimes 03/28/22,1015

## 2022-03-30 ENCOUNTER — Telehealth (HOSPITAL_COMMUNITY): Payer: Self-pay | Admitting: *Deleted

## 2022-03-30 NOTE — Telephone Encounter (Signed)
Patient voiced no questions or concerns regarding her health at this time. Has return appointment with OB scheduled for 1/24 to recheck BP.  EPDS=5. Patient voiced no questions or concerns regarding infant at this time. Patient reports infant sleeps in a crib on her back. RN reviewed ABCs of safe sleep. Patient verbalized understanding. Patient requested RN email information on hospital's virtual postpartum classes and support groups. Email sent. Erline Levine, RN, 03/30/22, [phone]

## 2022-11-10 ENCOUNTER — Other Ambulatory Visit: Payer: Self-pay

## 2022-11-10 ENCOUNTER — Ambulatory Visit: Payer: Medicaid Other | Attending: Obstetrics and Gynecology | Admitting: Physical Therapy

## 2022-11-10 DIAGNOSIS — M6281 Muscle weakness (generalized): Secondary | ICD-10-CM | POA: Insufficient documentation

## 2022-11-10 DIAGNOSIS — R279 Unspecified lack of coordination: Secondary | ICD-10-CM | POA: Insufficient documentation

## 2022-11-10 DIAGNOSIS — M629 Disorder of muscle, unspecified: Secondary | ICD-10-CM | POA: Insufficient documentation

## 2022-11-10 DIAGNOSIS — R293 Abnormal posture: Secondary | ICD-10-CM | POA: Insufficient documentation

## 2022-11-10 NOTE — Therapy (Signed)
OUTPATIENT PHYSICAL THERAPY FEMALE PELVIC EVALUATION   Patient Name: [name] [name] MRN: [id number] DOB:[birth year], 32 y.o., female Today's Date: 11/11/2022  END OF SESSION:  PT End of Session - 11/10/22 1146     Visit Number 1   Date for PT Re-Evaluation 03/12/23    Authorization Type [place] MEDICAID [employer]    Activity Tolerance --    Behavior During Therapy --           Start time 1100 End time 1134  Past Medical History:  Diagnosis Date   Abnormal Pap smear 03/09/2010   Genital warts    Past Surgical History:  Procedure Laterality Date   CESAREAN SECTION  2009   CESAREAN SECTION  08/12/2011   Procedure: CESAREAN SECTION;  Surgeon: Catalina Antigua, MD;  Location: [facility];  Service: Gynecology;  Laterality: N/A;   CESAREAN SECTION N/A 03/20/2022   Procedure: REPEAT CESAREAN SECTION;  Surgeon: Osborn Coho, MD;  Location: [facility];  Service: Obstetrics;  Laterality: N/A;   DILATION AND CURETTAGE OF UTERUS  2007   TUBAL LIGATION Bilateral 03/20/2022   Procedure: BILATERAL TUBAL LIGATION;  Surgeon: Osborn Coho, MD;  Location: [facility];  Service: Obstetrics;  Laterality: Bilateral;  Ligasure used on both tubes   Patient Active Problem List   Diagnosis Date Noted   Preeclampsia in postpartum period 03/26/2022   PPH (postpartum hemorrhage) 03/23/2022   Status post repeat low transverse cesarean section 03/20/2022   History of miscarriage x 2 03/14/2022   Penicillin allergy 03/14/2022   History of 2 cesarean sections 03/13/2022   Positive GBS test 03/13/2022   LGA (large for gestational age) fetus affecting management of mother 03/13/2022   Family history of hypertrophic cardiomyopathy 12/17/2016   Family history of Wolff-Parkinson-White (WPW) syndrome 12/17/2016    PCP: none per chart  REFERRING PROVIDER: Hands, Kylie, NP   REFERRING DIAG: M62.9 (ICD-10-CM) - Disorder of muscle, unspecified  THERAPY DIAG:  Muscle weakness (generalized) -  Plan: PT plan of care cert/re-cert  Abnormal posture - Plan: PT plan of care cert/re-cert  Unspecified lack of coordination - Plan: PT plan of care cert/re-cert  Rationale for Evaluation and Treatment: Rehabilitation  ONSET DATE: 7 months ago  SUBJECTIVE:                                                                                                                                                                                           SUBJECTIVE STATEMENT: Has mild urine leakage with working out, does state she has bleeding after all work outs. Denies after intercourse. Has also been having back pain since delivery. 3rd baby  all c-section. No longer breastfeeding. Reports has a lot of stress right now with less sleep.  Prior to baby - weight lifting 60# squats, did not really work out during pregnancy.  Son Alma Friendly, daughter 45yo, 92 month old  Fluid intake: Yes: water - a gallon day, a lot of caffeine right now (2 cups day) usually did not due caffeine .    PAIN:  Are you having pain? Yes NPRS scale: 5-10/10 Pain location:  low back  Pain type: dull constantly, but increases to shooting type Pain description: constant   Aggravating factors: prolonged sitting, daily activity (at end of day is worse, holding the baby for a while), periods (returned 2 months) Relieving factors: nothing really  PRECAUTIONS: Other: postpartum  RED FLAGS: None   WEIGHT BEARING RESTRICTIONS: No  FALLS:  Has patient fallen in last 6 months? No  LIVING ENVIRONMENT: Lives with: lives with their family Lives in: House/apartment   OCCUPATION: not currently   PLOF: Independent  PATIENT GOALS: to be stronger especially at pelvic floor, I want to get back to consistent strength training.   PERTINENT HISTORY:  Preeclampsia in postpartum period, 3 c-sections Sexual abuse: No  BOWEL MOVEMENT: Pain with bowel movement: Yes Type of bowel movement:Type (Bristol Stool Scale) 4, Frequency daily, and  Strain No Fully empty rectum: No Leakage: No Pads: No Fiber supplement: No  URINATION: Pain with urination: No Fully empty bladder: Yes:   Stream: Strong Urgency: Yes:   Frequency: 2x hourly; not really, gets up for baby  and may go to the bathroom.  Leakage: Urge to void Pads: No  INTERCOURSE: Pain with intercourse:  not painful, denies dryness, no bleeding  Ability to have vaginal penetration:  Yes:   Climax: not painful Marinoff Scale: 0/3  PREGNANCY: Vaginal deliveries 0  C-section deliveries 3 Currently pregnant No  PROLAPSE: None   OBJECTIVE:   DIAGNOSTIC FINDINGS:  Ultrasound done last week - (-)  PATIENT SURVEYS:   PFIQ-7 29  COGNITION: Overall cognitive status: Within functional limits for tasks assessed     SENSATION: Light touch: Appears intact Proprioception: Appears intact  MUSCLE LENGTH: Bil hamstrings and adductors limited by 25%   POSTURE: rounded shoulders, forward head, and anterior pelvic tilt  PELVIC ALIGNMENT:  LUMBARAROM/PROM:  A/PROM A/PROM  eval  Flexion WFL  Extension WFL  Right lateral flexion Limited by 25%  Left lateral flexion Limited by 25%  Right rotation Limited by 25%  Left rotation Limited by 25%   (Blank rows = not tested)  LOWER EXTREMITY ROM:  WFL  LOWER EXTREMITY MMT:  Bil hips grossly 4/5; knees 5/5  PALPATION:   General  no TTP, but did have some tension at lumbar spine and glutes C-section scar limited in all quadrants and in all directions.                 External Perineal Exam deferred until off of period                             Internal Pelvic Floor deferred until off of period  Patient confirms identification and approves PT to assess internal pelvic floor and treatment No  PELVIC MMT:   MMT eval  Vaginal   Internal Anal Sphincter   External Anal Sphincter   Puborectalis   Diastasis Recti <1 finger separation above and below with active crunch   (Blank rows = not tested)  TONE: deferred until off of period  PROLAPSE: deferred until off of period  TODAY'S TREATMENT:                                                                                                                              DATE:   11/10/22 EVAL Examination completed, findings reviewed, pt educated on POC, HEP. Pt motivated to participate in PT and agreeable to attempt recommendations.     PATIENT EDUCATION:  Education details: [phone] Person educated: Patient Education method: Explanation, Demonstration, Tactile cues, Verbal cues, and Handouts Education comprehension: verbalized understanding and returned demonstration  HOME EXERCISE PROGRAM: [email or web address]  ASSESSMENT:  CLINICAL IMPRESSION: Patient is a 32 y.o. female  who was seen today for physical therapy evaluation and treatment for urinary leakage and bleeding post working out with pt 7 months postpartum. Pt denied all symptoms before baby and was lifting a lot more pre baby and unable right now. Pt also having back pain since baby as well. Pt found to have decreased flexibility in spine and bil hips, decreased strength at core and hips. Pt would benefit from additional PT to further address deficits.     OBJECTIVE IMPAIRMENTS: decreased coordination, decreased endurance, decreased mobility, decreased strength, increased fascial restrictions, impaired flexibility, impaired sensation, improper body mechanics, postural dysfunction, and pain.   ACTIVITY LIMITATIONS: continence and exercise  PARTICIPATION LIMITATIONS: community activity  PERSONAL FACTORS: Fitness, Time since onset of injury/illness/exacerbation, and 1 comorbidity: x3 c-sections  are also affecting patient's functional outcome.   REHAB POTENTIAL: Good  CLINICAL DECISION MAKING: Evolving/moderate complexity  EVALUATION COMPLEXITY: Moderate   GOALS: Goals reviewed with patient? Yes  SHORT TERM GOALS: Target date: 12/08/22  Pt to be I with HEP.   Baseline: Goal status: INITIAL  2.  Pt to tolerate body weight squats with good technique and pressure management and denies bleeding.  Baseline:  Goal status: INITIAL   LONG TERM GOALS: Target date: 03/12/23  Pt to be I with advanced HEP.  Baseline:  Goal status: INITIAL  2.  Pt will report 50% reduction of pain due to improvements in posture, strength, and muscle length  Baseline: 10/10 at worst Goal status: INITIAL  3.  Pt to demonstrate at least 4/5 pelvic floor strength for improved pelvic stability and decreased strain at pelvic floor/ decrease leakage.  Baseline:  Goal status: INITIAL  4.  Pt to demonstrate improved coordination of pelvic floor and breathing mechanics with 30# squat with appropriate synergistic patterns to decrease pain and leakage at least 75% of the time.    Baseline:  Goal status: INITIAL  5.  Pt to demonstrate at least 5/5 bil hip strength for improved pelvic stability and functional squats without leakage.  Baseline:  Goal status: INITIAL  6. Pt to report no more than one urinary leakage instance in a month for improved QOL and decreased leakage.  Baseline:  Goal status: INITIAL  PLAN:  PT FREQUENCY: 2x/week  PT DURATION:  10 weeks  PLANNED INTERVENTIONS: Therapeutic exercises, Therapeutic activity, Neuromuscular re-education, Patient/Family education, Self Care, Joint mobilization, DME instructions, Aquatic Therapy, Dry Needling, Spinal mobilization, Cryotherapy, Moist heat, scar mobilization, Taping, Vasopneumatic device, Biofeedback, Ionotophoresis 4mg /ml Dexamethasone, Manual therapy, and Re-evaluation  PLAN FOR NEXT SESSION: core and hip strengthening, internal assessment if pt consents, coordination of pelvic floor and breathing, flexibility in back and hips,    Otelia Sergeant, PT, DPT 09/04/248:04 AM

## 2022-11-18 ENCOUNTER — Ambulatory Visit: Payer: Medicaid Other

## 2022-11-18 DIAGNOSIS — M629 Disorder of muscle, unspecified: Secondary | ICD-10-CM | POA: Diagnosis not present

## 2022-11-18 DIAGNOSIS — R279 Unspecified lack of coordination: Secondary | ICD-10-CM

## 2022-11-18 DIAGNOSIS — M6281 Muscle weakness (generalized): Secondary | ICD-10-CM

## 2022-11-18 DIAGNOSIS — R252 Cramp and spasm: Secondary | ICD-10-CM

## 2022-11-18 DIAGNOSIS — R293 Abnormal posture: Secondary | ICD-10-CM

## 2022-11-18 DIAGNOSIS — R262 Difficulty in walking, not elsewhere classified: Secondary | ICD-10-CM

## 2022-11-18 NOTE — Therapy (Signed)
OUTPATIENT PHYSICAL THERAPY FEMALE PELVIC EVALUATION   Patient Name: [name] [name] MRN: [id number] DOB:11-Aug-1990, 32 y.o., female Today's Date: 11/18/2022  END OF SESSION:  PT End of Session - 11/10/22 1146     Visit Number 1   Date for PT Re-Evaluation 03/12/23    Authorization Type [place] MEDICAID [employer]    Activity Tolerance --    Behavior During Therapy --           Start time 1100 End time 1134  Past Medical History:  Diagnosis Date   Abnormal Pap smear 03/09/2010   Genital warts    Past Surgical History:  Procedure Laterality Date   CESAREAN SECTION  2009   CESAREAN SECTION  08/12/2011   Procedure: CESAREAN SECTION;  Surgeon: Catalina Antigua, MD;  Location: [facility];  Service: Gynecology;  Laterality: N/A;   CESAREAN SECTION N/A 03/20/2022   Procedure: REPEAT CESAREAN SECTION;  Surgeon: Osborn Coho, MD;  Location: [facility];  Service: Obstetrics;  Laterality: N/A;   DILATION AND CURETTAGE OF UTERUS  2007   TUBAL LIGATION Bilateral 03/20/2022   Procedure: BILATERAL TUBAL LIGATION;  Surgeon: Osborn Coho, MD;  Location: [facility];  Service: Obstetrics;  Laterality: Bilateral;  Ligasure used on both tubes   Patient Active Problem List   Diagnosis Date Noted   Preeclampsia in postpartum period 03/26/2022   PPH (postpartum hemorrhage) 03/23/2022   Status post repeat low transverse cesarean section 03/20/2022   History of miscarriage x 2 03/14/2022   Penicillin allergy 03/14/2022   History of 2 cesarean sections 03/13/2022   Positive GBS test 03/13/2022   LGA (large for gestational age) fetus affecting management of mother 03/13/2022   Family history of hypertrophic cardiomyopathy 12/17/2016   Family history of Wolff-Parkinson-White (WPW) syndrome 12/17/2016    PCP: none per chart  REFERRING PROVIDER: Hands, Kylie, NP   REFERRING DIAG: M62.9 (ICD-10-CM) - Disorder of muscle, unspecified  THERAPY DIAG:  Muscle weakness  (generalized)  Abnormal posture  Difficulty in walking, not elsewhere classified  Cramp and spasm  Unspecified lack of coordination  Rationale for Evaluation and Treatment: Rehabilitation  ONSET DATE: 7 months ago  SUBJECTIVE:                                                                                                                                                                                           SUBJECTIVE STATEMENT: Patient states she is doing ok.  Went for a walk the other day for about 45 min to 1 hour, had some spotting again.    Fluid intake: Yes: water - a gallon day, a lot of  caffeine right now (2 cups day) usually did not due caffeine .    PAIN:  Are you having pain? Yes NPRS scale: 5-10/10 Pain location:  low back  Pain type: dull constantly, but increases to shooting type Pain description: constant   Aggravating factors: prolonged sitting, daily activity (at end of day is worse, holding the baby for a while), periods (returned 2 months) Relieving factors: nothing really  PRECAUTIONS: Other: postpartum  RED FLAGS: None   WEIGHT BEARING RESTRICTIONS: No  FALLS:  Has patient fallen in last 6 months? No  LIVING ENVIRONMENT: Lives with: lives with their family Lives in: House/apartment   OCCUPATION: not currently   PLOF: Independent  PATIENT GOALS: to be stronger especially at pelvic floor, I want to get back to consistent strength training.   PERTINENT HISTORY:  Preeclampsia in postpartum period, 3 c-sections Sexual abuse: No  BOWEL MOVEMENT: Pain with bowel movement: Yes Type of bowel movement:Type (Bristol Stool Scale) 4, Frequency daily, and Strain No Fully empty rectum: No Leakage: No Pads: No Fiber supplement: No  URINATION: Pain with urination: No Fully empty bladder: Yes:   Stream: Strong Urgency: Yes:   Frequency: 2x hourly; not really, gets up for baby  and may go to the bathroom.  Leakage: Urge to void Pads:  No  INTERCOURSE: Pain with intercourse:  not painful, denies dryness, no bleeding  Ability to have vaginal penetration:  Yes:   Climax: not painful Marinoff Scale: 0/3  PREGNANCY: Vaginal deliveries 0  C-section deliveries 3 Currently pregnant No  PROLAPSE: None   OBJECTIVE:   DIAGNOSTIC FINDINGS:  Ultrasound done last week - (-)  PATIENT SURVEYS:   PFIQ-7 29  COGNITION: Overall cognitive status: Within functional limits for tasks assessed     SENSATION: Light touch: Appears intact Proprioception: Appears intact  MUSCLE LENGTH: Bil hamstrings and adductors limited by 25%   POSTURE: rounded shoulders, forward head, and anterior pelvic tilt  PELVIC ALIGNMENT:  LUMBARAROM/PROM:  A/PROM A/PROM  eval  Flexion WFL  Extension WFL  Right lateral flexion Limited by 25%  Left lateral flexion Limited by 25%  Right rotation Limited by 25%  Left rotation Limited by 25%   (Blank rows = not tested)  LOWER EXTREMITY ROM:  WFL  LOWER EXTREMITY MMT:  Bil hips grossly 4/5; knees 5/5  PALPATION:   General  no TTP, but did have some tension at lumbar spine and glutes C-section scar limited in all quadrants and in all directions.                 External Perineal Exam deferred until off of period                             Internal Pelvic Floor deferred until off of period  Patient confirms identification and approves PT to assess internal pelvic floor and treatment No  PELVIC MMT:   MMT eval  Vaginal   Internal Anal Sphincter   External Anal Sphincter   Puborectalis   Diastasis Recti <1 finger separation above and below with active crunch   (Blank rows = not tested)        TONE: deferred until off of period  PROLAPSE: deferred until off of period  TODAY'S TREATMENT:  DATE:   11/08/22: Nustep x 5 min level 5 Hooklying PPT  with TA focus and pelvic floor engagement x 20 Bridge on red physio ball with pelvic floor engagement at top x 20 Single leg bridge with opposite foot flexed and driving heel toward ceiling x 10 each LE Hooklying ball squeeze x 20 with pelvic floor engagement on adduction with breathing out. Hooklying clam with red band with PPT and pelvic floor engagement x 20 Hooklying hip IR/ER combo stretch x 10 each side Hooklying trunk rotation x 20 90/90 heel taps x 20 Dying bug x 20  PPT with ball pass x 10 using red physio ball  Patient states she was a little sore in low back, offered ice but patient declined.  Encouraged her to use this at home if she remains sore.    11/10/22 EVAL Examination completed, findings reviewed, pt educated on POC, HEP. Pt motivated to participate in PT and agreeable to attempt recommendations.     PATIENT EDUCATION:  Education details: [phone] Person educated: Patient Education method: Explanation, Demonstration, Tactile cues, Verbal cues, and Handouts Education comprehension: verbalized understanding and returned demonstration  HOME EXERCISE PROGRAM: Access Code: [email or web address] URL: [email or web address] Date: 11/18/2022 Prepared by: Mikey Kirschner  Exercises - Wall Angels  - 1 x daily - 7 x weekly - 1 sets - 3 reps - 30s holds - Cat Cow  - 1 x daily - 7 x weekly - 1 sets - 10 reps - Supine Diaphragmatic Breathing  - 1 x daily - 7 x weekly - 1 sets - 10 reps - Supine Transversus Abdominis Bracing - Hands on Stomach  - 1 x daily - 7 x weekly - 2 sets - 10 reps - Sidelying Thoracic Rotation with Open Book  - 1 x daily - 7 x weekly - 1 sets - 3 reps - 30s holds - Supine Hip Internal and External Rotation  - 1 x daily - 7 x weekly - 1 sets - 10 reps - Supine 90/90 Alternating Heel Touches with Posterior Pelvic Tilt  - 1 x daily - 7 x weekly - 2 sets - 10 reps - Supine Dead Bug with Leg Extension  - 1 x daily - 7 x weekly - 2 sets - 10  reps  ASSESSMENT:  CLINICAL IMPRESSION: Anaiza was able to do all tasks with good control but did fatigue quickly.  She had some mild soreness in her low back likely due to weakness in core.  Instructed her to do as many reps as possible with proper pelvic position, if she begins to feel soreness, stop and reset pelvic tilt and resume to complete all reps.  New exercises added to HEP.  Patient using app to do exercises.    OBJECTIVE IMPAIRMENTS: decreased coordination, decreased endurance, decreased mobility, decreased strength, increased fascial restrictions, impaired flexibility, impaired sensation, improper body mechanics, postural dysfunction, and pain.   ACTIVITY LIMITATIONS: continence and exercise  PARTICIPATION LIMITATIONS: community activity  PERSONAL FACTORS: Fitness, Time since onset of injury/illness/exacerbation, and 1 comorbidity: x3 c-sections  are also affecting patient's functional outcome.   REHAB POTENTIAL: Good  CLINICAL DECISION MAKING: Evolving/moderate complexity  EVALUATION COMPLEXITY: Moderate   GOALS: Goals reviewed with patient? Yes  SHORT TERM GOALS: Target date: 12/08/22  Pt to be I with HEP.  Baseline: Goal status: INITIAL  2.  Pt to tolerate body weight squats with good technique and pressure management and denies bleeding.  Baseline:  Goal status: INITIAL   LONG TERM GOALS: Target  date: 03/12/23  Pt to be I with advanced HEP.  Baseline:  Goal status: INITIAL  2.  Pt will report 50% reduction of pain due to improvements in posture, strength, and muscle length  Baseline: 10/10 at worst Goal status: INITIAL  3.  Pt to demonstrate at least 4/5 pelvic floor strength for improved pelvic stability and decreased strain at pelvic floor/ decrease leakage.  Baseline:  Goal status: INITIAL  4.  Pt to demonstrate improved coordination of pelvic floor and breathing mechanics with 30# squat with appropriate synergistic patterns to decrease pain and  leakage at least 75% of the time.    Baseline:  Goal status: INITIAL  5.  Pt to demonstrate at least 5/5 bil hip strength for improved pelvic stability and functional squats without leakage.  Baseline:  Goal status: INITIAL  6. Pt to report no more than one urinary leakage instance in a month for improved QOL and decreased leakage.  Baseline:  Goal status: INITIAL  PLAN:  PT FREQUENCY: 2x/week  PT DURATION: 10 weeks  PLANNED INTERVENTIONS: Therapeutic exercises, Therapeutic activity, Neuromuscular re-education, Patient/Family education, Self Care, Joint mobilization, DME instructions, Aquatic Therapy, Dry Needling, Spinal mobilization, Cryotherapy, Moist heat, scar mobilization, Taping, Vasopneumatic device, Biofeedback, Ionotophoresis 4mg /ml Dexamethasone, Manual therapy, and Re-evaluation  PLAN FOR NEXT SESSION: Progress core and hip strengthening, coordination of pelvic floor and breathing, flexibility in back and hips,    Sherlie Boyum B. Neysha Criado, PT 11/18/22 10:54 AM [facility] [address] [place], [place] [address] Phone # [phone] Fax [phone]

## 2022-11-24 ENCOUNTER — Ambulatory Visit: Payer: Medicaid Other | Admitting: Physical Therapy

## 2022-11-24 DIAGNOSIS — M6281 Muscle weakness (generalized): Secondary | ICD-10-CM

## 2022-11-24 DIAGNOSIS — R279 Unspecified lack of coordination: Secondary | ICD-10-CM

## 2022-11-24 DIAGNOSIS — M629 Disorder of muscle, unspecified: Secondary | ICD-10-CM | POA: Diagnosis not present

## 2022-11-24 DIAGNOSIS — R293 Abnormal posture: Secondary | ICD-10-CM

## 2022-11-24 NOTE — Therapy (Signed)
OUTPATIENT PHYSICAL THERAPY FEMALE PELVIC EVALUATION   Patient Name: [name] [name] MRN: [id number] DOB:Apr 15, 1990, 31 y.o., female Today's Date: 11/24/2022  END OF SESSION:  PT End of Session - 11/24/22 1230     Visit Number 3    Date for PT Re-Evaluation 03/12/23    Authorization Type [place] MEDICAID [employer]    PT Start Time 1230    PT Stop Time 1310    PT Time Calculation (min) 40 min    Activity Tolerance Patient tolerated treatment well    Behavior During Therapy [facility] for tasks assessed/performed              Past Medical History:  Diagnosis Date   Abnormal Pap smear 03/09/2010   Genital warts    Past Surgical History:  Procedure Laterality Date   CESAREAN SECTION  2009   CESAREAN SECTION  08/12/2011   Procedure: CESAREAN SECTION;  Surgeon: Catalina Antigua, MD;  Location: [facility];  Service: Gynecology;  Laterality: N/A;   CESAREAN SECTION N/A 03/20/2022   Procedure: REPEAT CESAREAN SECTION;  Surgeon: Osborn Coho, MD;  Location: [facility];  Service: Obstetrics;  Laterality: N/A;   DILATION AND CURETTAGE OF UTERUS  2007   TUBAL LIGATION Bilateral 03/20/2022   Procedure: BILATERAL TUBAL LIGATION;  Surgeon: Osborn Coho, MD;  Location: [facility];  Service: Obstetrics;  Laterality: Bilateral;  Ligasure used on both tubes   Patient Active Problem List   Diagnosis Date Noted   Preeclampsia in postpartum period 03/26/2022   PPH (postpartum hemorrhage) 03/23/2022   Status post repeat low transverse cesarean section 03/20/2022   History of miscarriage x 2 03/14/2022   Penicillin allergy 03/14/2022   History of 2 cesarean sections 03/13/2022   Positive GBS test 03/13/2022   LGA (large for gestational age) fetus affecting management of mother 03/13/2022   Family history of hypertrophic cardiomyopathy 12/17/2016   Family history of Wolff-Parkinson-White (WPW) syndrome 12/17/2016    PCP: none per chart  REFERRING PROVIDER: Hands, Kylie,  NP   REFERRING DIAG: M62.9 (ICD-10-CM) - Disorder of muscle, unspecified  THERAPY DIAG:  Muscle weakness (generalized)  Abnormal posture  Unspecified lack of coordination  Rationale for Evaluation and Treatment: Rehabilitation  ONSET DATE: 7 months ago  SUBJECTIVE:                                                                                                                                                                                           SUBJECTIVE STATEMENT: Back pain worse this week. Has not had spotting this week but has not been as active.  Fluid intake: Yes: water - a  gallon day, a lot of caffeine right now (2 cups day) usually did not due caffeine .    PAIN:  Are you having pain? Yes NPRS scale: 8/10 Pain location:  low back  Pain type: dull constantly, but increases to shooting type Pain description: constant   Aggravating factors: prolonged sitting, daily activity (at end of day is worse, holding the baby for a while), periods (returned 2 months) Relieving factors: nothing really  PRECAUTIONS: Other: postpartum  RED FLAGS: None   WEIGHT BEARING RESTRICTIONS: No  FALLS:  Has patient fallen in last 6 months? No  LIVING ENVIRONMENT: Lives with: lives with their family Lives in: House/apartment   OCCUPATION: not currently   PLOF: Independent  PATIENT GOALS: to be stronger especially at pelvic floor, I want to get back to consistent strength training.   PERTINENT HISTORY:  Preeclampsia in postpartum period, 3 c-sections Sexual abuse: No  BOWEL MOVEMENT: Pain with bowel movement: Yes Type of bowel movement:Type (Bristol Stool Scale) 4, Frequency daily, and Strain No Fully empty rectum: No Leakage: No Pads: No Fiber supplement: No  URINATION: Pain with urination: No Fully empty bladder: Yes:   Stream: Strong Urgency: Yes:   Frequency: 2x hourly; not really, gets up for baby  and may go to the bathroom.  Leakage: Urge to void Pads:  No  INTERCOURSE: Pain with intercourse:  not painful, denies dryness, no bleeding  Ability to have vaginal penetration:  Yes:   Climax: not painful Marinoff Scale: 0/3  PREGNANCY: Vaginal deliveries 0  C-section deliveries 3 Currently pregnant No  PROLAPSE: None   OBJECTIVE:   DIAGNOSTIC FINDINGS:  Ultrasound done last week - (-)  PATIENT SURVEYS:   PFIQ-7 29  COGNITION: Overall cognitive status: Within functional limits for tasks assessed     SENSATION: Light touch: Appears intact Proprioception: Appears intact  MUSCLE LENGTH: Bil hamstrings and adductors limited by 25%   POSTURE: rounded shoulders, forward head, and anterior pelvic tilt  PELVIC ALIGNMENT:  LUMBARAROM/PROM:  A/PROM A/PROM  eval  Flexion WFL  Extension WFL  Right lateral flexion Limited by 25%  Left lateral flexion Limited by 25%  Right rotation Limited by 25%  Left rotation Limited by 25%   (Blank rows = not tested)  LOWER EXTREMITY ROM:  WFL  LOWER EXTREMITY MMT:  Bil hips grossly 4/5; knees 5/5  PALPATION:   General  no TTP, but did have some tension at lumbar spine and glutes C-section scar limited in all quadrants and in all directions.                 External Perineal Exam - no pain, [facility]                             Internal Pelvic Floor no pain  Patient confirms identification and approves PT to assess internal pelvic floor and treatment No  PELVIC MMT:   MMT eval 11/24/22   Vaginal  3/5; 3s; 4x  Internal Anal Sphincter    External Anal Sphincter    Puborectalis    Diastasis Recti <1 finger separation above and below with active crunch    (Blank rows = not tested)        TONE: WFL  PROLAPSE: Not seen in hooklying with cough   TODAY'S TREATMENT:  DATE:   11/24/22:  Manual: pelvic floor muscle assessment/ internal - see above for  findings. Did benefit from cues for coordination of breathing and pelvic floor contraction. Was able to complete with cues and reps.  Gentle C section scar massage with cues for diaphragmatic breathing and relaxation techniques for improved mobility and decreased tension and gripping at abdomen. Pt very hesitate to tough and mobilize scar herself but was comfortable resting hands at scar for cuing diaphragmatic breathing.     No emotional/communication barriers or cognitive limitation. Patient is motivated to learn. Patient understands and agrees with treatment goals and plan. PT explains patient will be examined in standing, sitting, and lying down to see how their muscles and joints work. When they are ready, they will be asked to remove their underwear so PT can examine their perineum. The patient is also given the option of providing their own chaperone as one is not provided in our facility. The patient also has the right and is explained the right to defer or refuse any part of the evaluation or treatment including the internal exam. With the patient's consent, PT will use one gloved finger to gently assess the muscles of the pelvic floor, seeing how well it contracts and relaxes and if there is muscle symmetry. After, the patient will get dressed and PT and patient will discuss exam findings and plan of care. PT and patient discuss plan of care, schedule, attendance policy and HEP activities.                 11/08/22: Nustep x 5 min level 5 Hooklying PPT with TA focus and pelvic floor engagement x 20 Bridge on red physio ball with pelvic floor engagement at top x 20 Single leg bridge with opposite foot flexed and driving heel toward ceiling x 10 each LE Hooklying ball squeeze x 20 with pelvic floor engagement on adduction with breathing out. Hooklying clam with red band with PPT and pelvic floor engagement x 20 Hooklying hip IR/ER combo stretch x 10 each side Hooklying trunk rotation x 20 90/90  heel taps x 20 Dying bug x 20  PPT with ball pass x 10 using red physio ball  Patient states she was a little sore in low back, offered ice but patient declined.  Encouraged her to use this at home if she remains sore.     PATIENT EDUCATION:  Education details: [phone] Person educated: Patient Education method: Explanation, Demonstration, Tactile cues, Verbal cues, and Handouts Education comprehension: verbalized understanding and returned demonstration  HOME EXERCISE PROGRAM: Access Code: [email or web address] URL: [email or web address] Date: 11/18/2022 Prepared by: Mikey Kirschner  Exercises - Wall Angels  - 1 x daily - 7 x weekly - 1 sets - 3 reps - 30s holds - Cat Cow  - 1 x daily - 7 x weekly - 1 sets - 10 reps - Supine Diaphragmatic Breathing  - 1 x daily - 7 x weekly - 1 sets - 10 reps - Supine Transversus Abdominis Bracing - Hands on Stomach  - 1 x daily - 7 x weekly - 2 sets - 10 reps - Sidelying Thoracic Rotation with Open Book  - 1 x daily - 7 x weekly - 1 sets - 3 reps - 30s holds - Supine Hip Internal and External Rotation  - 1 x daily - 7 x weekly - 1 sets - 10 reps - Supine 90/90 Alternating Heel Touches with Posterior Pelvic Tilt  - 1 x daily - 7 x weekly -  2 sets - 10 reps - Supine Dead Bug with Leg Extension  - 1 x daily - 7 x weekly - 2 sets - 10 reps  ASSESSMENT:  CLINICAL IMPRESSION: Pt did well with education on scar massage and manual pelvic floor internal and scar massage. She has a lot of guarding and abdominal scar tenderness that is likely contributing to low back pain. She will benefit from adding it to her HEP to decrease scar sensitivity and tension.    OBJECTIVE IMPAIRMENTS: decreased coordination, decreased endurance, decreased mobility, decreased strength, increased fascial restrictions, impaired flexibility, impaired sensation, improper body mechanics, postural dysfunction, and pain.   ACTIVITY LIMITATIONS: continence and exercise  PARTICIPATION  LIMITATIONS: community activity  PERSONAL FACTORS: Fitness, Time since onset of injury/illness/exacerbation, and 1 comorbidity: x3 c-sections  are also affecting patient's functional outcome.   REHAB POTENTIAL: Good  CLINICAL DECISION MAKING: Evolving/moderate complexity  EVALUATION COMPLEXITY: Moderate   GOALS: Goals reviewed with patient? Yes  SHORT TERM GOALS: Target date: 12/08/22  Pt to be I with HEP.  Baseline: Goal status: INITIAL  2.  Pt to tolerate body weight squats with good technique and pressure management and denies bleeding.  Baseline:  Goal status: INITIAL   LONG TERM GOALS: Target date: 03/12/23  Pt to be I with advanced HEP.  Baseline:  Goal status: INITIAL  2.  Pt will report 50% reduction of pain due to improvements in posture, strength, and muscle length  Baseline: 10/10 at worst Goal status: INITIAL  3.  Pt to demonstrate at least 4/5 pelvic floor strength for improved pelvic stability and decreased strain at pelvic floor/ decrease leakage.  Baseline:  Goal status: INITIAL  4.  Pt to demonstrate improved coordination of pelvic floor and breathing mechanics with 30# squat with appropriate synergistic patterns to decrease pain and leakage at least 75% of the time.    Baseline:  Goal status: INITIAL  5.  Pt to demonstrate at least 5/5 bil hip strength for improved pelvic stability and functional squats without leakage.  Baseline:  Goal status: INITIAL  6. Pt to report no more than one urinary leakage instance in a month for improved QOL and decreased leakage.  Baseline:  Goal status: INITIAL  PLAN:  PT FREQUENCY: 2x/week  PT DURATION: 10 weeks  PLANNED INTERVENTIONS: Therapeutic exercises, Therapeutic activity, Neuromuscular re-education, Patient/Family education, Self Care, Joint mobilization, DME instructions, Aquatic Therapy, Dry Needling, Spinal mobilization, Cryotherapy, Moist heat, scar mobilization, Taping, Vasopneumatic device,  Biofeedback, Ionotophoresis 4mg /ml Dexamethasone, Manual therapy, and Re-evaluation  PLAN FOR NEXT SESSION: Progress core and hip strengthening, coordination of pelvic floor and breathing, flexibility in back and hips,    Otelia Sergeant, PT, DPT 09/17/244:32 PM  [facility] [address], [address] [place], [place] [address] Phone # [phone] Fax [phone]

## 2022-11-26 ENCOUNTER — Ambulatory Visit: Payer: Medicaid Other

## 2022-11-26 DIAGNOSIS — M6281 Muscle weakness (generalized): Secondary | ICD-10-CM

## 2022-11-26 DIAGNOSIS — R252 Cramp and spasm: Secondary | ICD-10-CM

## 2022-11-26 DIAGNOSIS — R262 Difficulty in walking, not elsewhere classified: Secondary | ICD-10-CM

## 2022-11-26 DIAGNOSIS — M629 Disorder of muscle, unspecified: Secondary | ICD-10-CM | POA: Diagnosis not present

## 2022-11-26 DIAGNOSIS — R293 Abnormal posture: Secondary | ICD-10-CM

## 2022-11-26 NOTE — Therapy (Signed)
OUTPATIENT PHYSICAL THERAPY FEMALE PELVIC EVALUATION   Patient Name: [name] [name] MRN: [id number] DOB:[birth year], 32 y.o., female Today's Date: 11/26/2022  END OF SESSION:  PT End of Session - 11/26/22 0803     Visit Number 4    Date for PT Re-Evaluation 03/12/23    Authorization Type [place] MEDICAID [employer]    PT Start Time 0803    PT Stop Time 0845    PT Time Calculation (min) 42 min    Activity Tolerance Patient tolerated treatment well    Behavior During Therapy [facility] for tasks assessed/performed              Past Medical History:  Diagnosis Date   Abnormal Pap smear 03/09/2010   Genital warts    Past Surgical History:  Procedure Laterality Date   CESAREAN SECTION  2009   CESAREAN SECTION  08/12/2011   Procedure: CESAREAN SECTION;  Surgeon: Catalina Antigua, MD;  Location: [facility];  Service: Gynecology;  Laterality: N/A;   CESAREAN SECTION N/A 03/20/2022   Procedure: REPEAT CESAREAN SECTION;  Surgeon: Osborn Coho, MD;  Location: [facility];  Service: Obstetrics;  Laterality: N/A;   DILATION AND CURETTAGE OF UTERUS  2007   TUBAL LIGATION Bilateral 03/20/2022   Procedure: BILATERAL TUBAL LIGATION;  Surgeon: Osborn Coho, MD;  Location: [facility];  Service: Obstetrics;  Laterality: Bilateral;  Ligasure used on both tubes   Patient Active Problem List   Diagnosis Date Noted   Preeclampsia in postpartum period 03/26/2022   PPH (postpartum hemorrhage) 03/23/2022   Status post repeat low transverse cesarean section 03/20/2022   History of miscarriage x 2 03/14/2022   Penicillin allergy 03/14/2022   History of 2 cesarean sections 03/13/2022   Positive GBS test 03/13/2022   LGA (large for gestational age) fetus affecting management of mother 03/13/2022   Family history of hypertrophic cardiomyopathy 12/17/2016   Family history of Wolff-Parkinson-White (WPW) syndrome 12/17/2016    PCP: none per chart  REFERRING PROVIDER: Hands, Kylie,  NP   REFERRING DIAG: M62.9 (ICD-10-CM) - Disorder of muscle, unspecified  THERAPY DIAG:  Muscle weakness (generalized)  Cramp and spasm  Difficulty in walking, not elsewhere classified  Abnormal posture  Rationale for Evaluation and Treatment: Rehabilitation  ONSET DATE: 7 months ago  SUBJECTIVE:                                                                                                                                                                                           SUBJECTIVE STATEMENT: Patient states she is continuing to have back pain but it's manageable.  I admit, I haven't been  able to do much since last time I saw you.    Fluid intake: Yes: water - a gallon day, a lot of caffeine right now (2 cups day) usually did not due caffeine .    PAIN:  Are you having pain? Yes NPRS scale: 8/10 Pain location:  low back  Pain type: dull constantly, but increases to shooting type Pain description: constant   Aggravating factors: prolonged sitting, daily activity (at end of day is worse, holding the baby for a while), periods (returned 2 months) Relieving factors: nothing really  PRECAUTIONS: Other: postpartum  RED FLAGS: None   WEIGHT BEARING RESTRICTIONS: No  FALLS:  Has patient fallen in last 6 months? No  LIVING ENVIRONMENT: Lives with: lives with their family Lives in: House/apartment   OCCUPATION: not currently   PLOF: Independent  PATIENT GOALS: to be stronger especially at pelvic floor, I want to get back to consistent strength training.   PERTINENT HISTORY:  Preeclampsia in postpartum period, 3 c-sections Sexual abuse: No  BOWEL MOVEMENT: Pain with bowel movement: Yes Type of bowel movement:Type (Bristol Stool Scale) 4, Frequency daily, and Strain No Fully empty rectum: No Leakage: No Pads: No Fiber supplement: No  URINATION: Pain with urination: No Fully empty bladder: Yes:   Stream: Strong Urgency: Yes:   Frequency: 2x hourly;  not really, gets up for baby  and may go to the bathroom.  Leakage: Urge to void Pads: No  INTERCOURSE: Pain with intercourse:  not painful, denies dryness, no bleeding  Ability to have vaginal penetration:  Yes:   Climax: not painful Marinoff Scale: 0/3  PREGNANCY: Vaginal deliveries 0  C-section deliveries 3 Currently pregnant No  PROLAPSE: None   OBJECTIVE:   DIAGNOSTIC FINDINGS:  Ultrasound done last week - (-)  PATIENT SURVEYS:   PFIQ-7 29  COGNITION: Overall cognitive status: Within functional limits for tasks assessed     SENSATION: Light touch: Appears intact Proprioception: Appears intact  MUSCLE LENGTH: Bil hamstrings and adductors limited by 25%   POSTURE: rounded shoulders, forward head, and anterior pelvic tilt  PELVIC ALIGNMENT:  LUMBARAROM/PROM:  A/PROM A/PROM  eval  Flexion WFL  Extension WFL  Right lateral flexion Limited by 25%  Left lateral flexion Limited by 25%  Right rotation Limited by 25%  Left rotation Limited by 25%   (Blank rows = not tested)  LOWER EXTREMITY ROM:  WFL  LOWER EXTREMITY MMT:  Bil hips grossly 4/5; knees 5/5  PALPATION:   General  no TTP, but did have some tension at lumbar spine and glutes C-section scar limited in all quadrants and in all directions.                 External Perineal Exam - no pain, [facility]                             Internal Pelvic Floor no pain  Patient confirms identification and approves PT to assess internal pelvic floor and treatment No  PELVIC MMT:   MMT eval 11/24/22   Vaginal  3/5; 3s; 4x  Internal Anal Sphincter    External Anal Sphincter    Puborectalis    Diastasis Recti <1 finger separation above and below with active crunch    (Blank rows = not tested)        TONE: WFL  PROLAPSE: Not seen in hooklying with cough   TODAY'S TREATMENT:  DATE:  11/26/22: Nustep x 5 min level 5 Hooklying PPT with TA focus and pelvic floor engagement x 20 Bridge on red physio ball with pelvic floor engagement at top x 20 Bridge on 16" side of box with pelvic floor engagement at top x 20 Hooklying ball squeeze x 20 with pelvic floor engagement  Hooklying clam with red band with PPT and pelvic floor engagement x 20 Hooklying hip IR/ER combo stretch x 10 each side Hooklying trunk rotation x 20 90/90 heel taps x 20 Dying bug x 20  PPT with ball pass x 10 using red physio ball  Standing hamstring stretch 3 x 30 sec Standing quad/hip flexor stretch 3 x 30 sec Patient states she was a little sore in low back, offered ice but patient declined.  Encouraged her to use this at home if she remains sore.  11/24/22:  Manual: pelvic floor muscle assessment/ internal - see above for findings. Did benefit from cues for coordination of breathing and pelvic floor contraction. Was able to complete with cues and reps.  Gentle C section scar massage with cues for diaphragmatic breathing and relaxation techniques for improved mobility and decreased tension and gripping at abdomen. Pt very hesitate to tough and mobilize scar herself but was comfortable resting hands at scar for cuing diaphragmatic breathing.     No emotional/communication barriers or cognitive limitation. Patient is motivated to learn. Patient understands and agrees with treatment goals and plan. PT explains patient will be examined in standing, sitting, and lying down to see how their muscles and joints work. When they are ready, they will be asked to remove their underwear so PT can examine their perineum. The patient is also given the option of providing their own chaperone as one is not provided in our facility. The patient also has the right and is explained the right to defer or refuse any part of the evaluation or treatment including the internal exam. With the patient's consent, PT will use one  gloved finger to gently assess the muscles of the pelvic floor, seeing how well it contracts and relaxes and if there is muscle symmetry. After, the patient will get dressed and PT and patient will discuss exam findings and plan of care. PT and patient discuss plan of care, schedule, attendance policy and HEP activities.                 11/08/22: Nustep x 5 min level 5 Hooklying PPT with TA focus and pelvic floor engagement x 20 Bridge on red physio ball with pelvic floor engagement at top x 20 Single leg bridge with opposite foot flexed and driving heel toward ceiling x 10 each LE Hooklying ball squeeze x 20 with pelvic floor engagement on adduction with breathing out. Hooklying clam with red band with PPT and pelvic floor engagement x 20 Hooklying hip IR/ER combo stretch x 10 each side Hooklying trunk rotation x 20 90/90 heel taps x 20 Dying bug x 20  PPT with ball pass x 10 using red physio ball  Patient states she was a little sore in low back, offered ice but patient declined.  Encouraged her to use this at home if she remains sore.     PATIENT EDUCATION:  Education details: [id number] Person educated: Patient Education method: Explanation, Demonstration, Tactile cues, Verbal cues, and Handouts Education comprehension: verbalized understanding and returned demonstration  HOME EXERCISE PROGRAM: Access Code: [email or web address] URL: [email or web address] Date: 11/26/2022 Prepared by: Mikey Kirschner  Exercises - Wall Angels  -  1 x daily - 7 x weekly - 1 sets - 3 reps - 30s holds - Cat Cow  - 1 x daily - 7 x weekly - 1 sets - 10 reps - Supine Diaphragmatic Breathing  - 1 x daily - 7 x weekly - 1 sets - 10 reps - Supine Transversus Abdominis Bracing - Hands on Stomach  - 1 x daily - 7 x weekly - 2 sets - 10 reps - Sidelying Thoracic Rotation with Open Book  - 1 x daily - 7 x weekly - 1 sets - 3 reps - 30s holds - Supine Hip Internal and External Rotation  - 1 x daily - 7 x weekly -  1 sets - 10 reps - Supine 90/90 Alternating Heel Touches with Posterior Pelvic Tilt  - 1 x daily - 7 x weekly - 2 sets - 10 reps - Supine Dead Bug with Leg Extension  - 1 x daily - 7 x weekly - 2 sets - 10 reps - Standing Hamstring Stretch on Chair  - 1 x daily - 7 x weekly - 1 sets - 3 reps - 30 sec hold - Standing Quad Stretch with Table and Chair Support  - 1 x daily - 7 x weekly - 1 sets - 3 reps - 30 hold ASSESSMENT:  CLINICAL IMPRESSION: Ridwan is able to tolerate higher level core strengthening with good technique and control.  We added in hamstring and hip flexor stretching today.  She did experience greater tightness in one side vs the other when doing isolated stretches for hamstring and quads.  She reported no increase in pain with today's activities.  She would benefit from continued skilled PT for LE flexibility and core strengthening.    OBJECTIVE IMPAIRMENTS: decreased coordination, decreased endurance, decreased mobility, decreased strength, increased fascial restrictions, impaired flexibility, impaired sensation, improper body mechanics, postural dysfunction, and pain.   ACTIVITY LIMITATIONS: continence and exercise  PARTICIPATION LIMITATIONS: community activity  PERSONAL FACTORS: Fitness, Time since onset of injury/illness/exacerbation, and 1 comorbidity: x3 c-sections  are also affecting patient's functional outcome.   REHAB POTENTIAL: Good  CLINICAL DECISION MAKING: Evolving/moderate complexity  EVALUATION COMPLEXITY: Moderate   GOALS: Goals reviewed with patient? Yes  SHORT TERM GOALS: Target date: 12/08/22  Pt to be I with HEP.  Baseline: Goal status: INITIAL  2.  Pt to tolerate body weight squats with good technique and pressure management and denies bleeding.  Baseline:  Goal status: INITIAL   LONG TERM GOALS: Target date: 03/12/23  Pt to be I with advanced HEP.  Baseline:  Goal status: INITIAL  2.  Pt will report 50% reduction of pain due to  improvements in posture, strength, and muscle length  Baseline: 10/10 at worst Goal status: INITIAL  3.  Pt to demonstrate at least 4/5 pelvic floor strength for improved pelvic stability and decreased strain at pelvic floor/ decrease leakage.  Baseline:  Goal status: INITIAL  4.  Pt to demonstrate improved coordination of pelvic floor and breathing mechanics with 30# squat with appropriate synergistic patterns to decrease pain and leakage at least 75% of the time.    Baseline:  Goal status: INITIAL  5.  Pt to demonstrate at least 5/5 bil hip strength for improved pelvic stability and functional squats without leakage.  Baseline:  Goal status: INITIAL  6. Pt to report no more than one urinary leakage instance in a month for improved QOL and decreased leakage.  Baseline:  Goal status: INITIAL  PLAN:  PT  FREQUENCY: 2x/week  PT DURATION: 10 weeks  PLANNED INTERVENTIONS: Therapeutic exercises, Therapeutic activity, Neuromuscular re-education, Patient/Family education, Self Care, Joint mobilization, DME instructions, Aquatic Therapy, Dry Needling, Spinal mobilization, Cryotherapy, Moist heat, scar mobilization, Taping, Vasopneumatic device, Biofeedback, Ionotophoresis 4mg /ml Dexamethasone, Manual therapy, and Re-evaluation  PLAN FOR NEXT SESSION: Progress core and hip strengthening, coordination of pelvic floor and breathing, flexibility in back and hips,    Lorenda Grecco B. Jyles Sontag, PT 11/26/22 8:48 AM [facility] [address], [address] [place], [place] [address] Phone # [phone] Fax [phone]

## 2022-12-01 ENCOUNTER — Ambulatory Visit: Payer: Medicaid Other | Admitting: Physical Therapy

## 2022-12-01 DIAGNOSIS — R293 Abnormal posture: Secondary | ICD-10-CM

## 2022-12-01 DIAGNOSIS — M6281 Muscle weakness (generalized): Secondary | ICD-10-CM

## 2022-12-01 DIAGNOSIS — M629 Disorder of muscle, unspecified: Secondary | ICD-10-CM | POA: Diagnosis not present

## 2022-12-01 NOTE — Therapy (Signed)
OUTPATIENT PHYSICAL THERAPY FEMALE PELVIC EVALUATION   Patient Name: [name] [name] MRN: [id number] DOB:08-19-1990, 32 y.o., female Today's Date: 12/01/2022  END OF SESSION:  PT End of Session - 12/01/22 1231     Visit Number 5    Date for PT Re-Evaluation 03/12/23    Authorization Type [place] MEDICAID [employer]    PT Start Time 1230    PT Stop Time 1311    PT Time Calculation (min) 41 min    Activity Tolerance Patient tolerated treatment well    Behavior During Therapy [facility] for tasks assessed/performed              Past Medical History:  Diagnosis Date   Abnormal Pap smear 03/09/2010   Genital warts    Past Surgical History:  Procedure Laterality Date   CESAREAN SECTION  2009   CESAREAN SECTION  08/12/2011   Procedure: CESAREAN SECTION;  Surgeon: Catalina Antigua, MD;  Location: [facility];  Service: Gynecology;  Laterality: N/A;   CESAREAN SECTION N/A 03/20/2022   Procedure: REPEAT CESAREAN SECTION;  Surgeon: Osborn Coho, MD;  Location: [facility];  Service: Obstetrics;  Laterality: N/A;   DILATION AND CURETTAGE OF UTERUS  2007   TUBAL LIGATION Bilateral 03/20/2022   Procedure: BILATERAL TUBAL LIGATION;  Surgeon: Osborn Coho, MD;  Location: [facility];  Service: Obstetrics;  Laterality: Bilateral;  Ligasure used on both tubes   Patient Active Problem List   Diagnosis Date Noted   Preeclampsia in postpartum period 03/26/2022   PPH (postpartum hemorrhage) 03/23/2022   Status post repeat low transverse cesarean section 03/20/2022   History of miscarriage x 2 03/14/2022   Penicillin allergy 03/14/2022   History of 2 cesarean sections 03/13/2022   Positive GBS test 03/13/2022   LGA (large for gestational age) fetus affecting management of mother 03/13/2022   Family history of hypertrophic cardiomyopathy 12/17/2016   Family history of Wolff-Parkinson-White (WPW) syndrome 12/17/2016    PCP: none per chart  REFERRING PROVIDER: Hands, Kylie,  NP   REFERRING DIAG: M62.9 (ICD-10-CM) - Disorder of muscle, unspecified  THERAPY DIAG:  Muscle weakness (generalized)  Abnormal posture  Rationale for Evaluation and Treatment: Rehabilitation  ONSET DATE: 7 months ago  SUBJECTIVE:                                                                                                                                                                                           SUBJECTIVE STATEMENT: Patient states she is continuing to have back pain but it's manageable.  Has been doing HEP and states no longer having spotting, and has been doing scar mobility.  Fluid intake: Yes: water - a gallon day, a lot of caffeine right now (2 cups day) usually did not due caffeine .    PAIN:  Are you having pain? Yes NPRS scale: 8/10 Pain location:  low back  Pain type: dull constantly, but increases to shooting type Pain description: constant   Aggravating factors: prolonged sitting, daily activity (at end of day is worse, holding the baby for a while), periods (returned 2 months) Relieving factors: nothing really  PRECAUTIONS: Other: postpartum  RED FLAGS: None   WEIGHT BEARING RESTRICTIONS: No  FALLS:  Has patient fallen in last 6 months? No  LIVING ENVIRONMENT: Lives with: lives with their family Lives in: House/apartment   OCCUPATION: not currently   PLOF: Independent  PATIENT GOALS: to be stronger especially at pelvic floor, I want to get back to consistent strength training.   PERTINENT HISTORY:  Preeclampsia in postpartum period, 3 c-sections Sexual abuse: No  BOWEL MOVEMENT: Pain with bowel movement: Yes Type of bowel movement:Type (Bristol Stool Scale) 4, Frequency daily, and Strain No Fully empty rectum: No Leakage: No Pads: No Fiber supplement: No  URINATION: Pain with urination: No Fully empty bladder: Yes:   Stream: Strong Urgency: Yes:   Frequency: 2x hourly; not really, gets up for baby  and may go to the  bathroom.  Leakage: Urge to void Pads: No  INTERCOURSE: Pain with intercourse:  not painful, denies dryness, no bleeding  Ability to have vaginal penetration:  Yes:   Climax: not painful Marinoff Scale: 0/3  PREGNANCY: Vaginal deliveries 0  C-section deliveries 3 Currently pregnant No  PROLAPSE: None   OBJECTIVE:   DIAGNOSTIC FINDINGS:  Ultrasound done last week - (-)  PATIENT SURVEYS:   PFIQ-7 29  COGNITION: Overall cognitive status: Within functional limits for tasks assessed     SENSATION: Light touch: Appears intact Proprioception: Appears intact  MUSCLE LENGTH: Bil hamstrings and adductors limited by 25%   POSTURE: rounded shoulders, forward head, and anterior pelvic tilt  PELVIC ALIGNMENT:  LUMBARAROM/PROM:  A/PROM A/PROM  eval  Flexion WFL  Extension WFL  Right lateral flexion Limited by 25%  Left lateral flexion Limited by 25%  Right rotation Limited by 25%  Left rotation Limited by 25%   (Blank rows = not tested)  LOWER EXTREMITY ROM:  WFL  LOWER EXTREMITY MMT:  Bil hips grossly 4/5; knees 5/5  PALPATION:   General  no TTP, but did have some tension at lumbar spine and glutes C-section scar limited in all quadrants and in all directions.                 External Perineal Exam - no pain, [facility]                             Internal Pelvic Floor no pain  Patient confirms identification and approves PT to assess internal pelvic floor and treatment No  PELVIC MMT:   MMT eval 11/24/22   Vaginal  3/5; 3s; 4x  Internal Anal Sphincter    External Anal Sphincter    Puborectalis    Diastasis Recti <1 finger separation above and below with active crunch    (Blank rows = not tested)        TONE: WFL  PROLAPSE: Not seen in hooklying with cough   TODAY'S TREATMENT:  DATE:  12/01/22  Scar mobility - gentle scar  tissue mobility with fascial release work starting surrounding scar site at c-section scar and progressing from indirect to direct release technique within pt's tolerance, then onto scar site.  Suction cup used after this at surrounding tissue with pt cued for diaphragmatic breathing all above scar site.   11/26/22: Nustep x 5 min level 5 Hooklying PPT with TA focus and pelvic floor engagement x 20 Bridge on red physio ball with pelvic floor engagement at top x 20 Bridge on 16" side of box with pelvic floor engagement at top x 20 Hooklying ball squeeze x 20 with pelvic floor engagement  Hooklying clam with red band with PPT and pelvic floor engagement x 20 Hooklying hip IR/ER combo stretch x 10 each side Hooklying trunk rotation x 20 90/90 heel taps x 20 Dying bug x 20  PPT with ball pass x 10 using red physio ball  Standing hamstring stretch 3 x 30 sec Standing quad/hip flexor stretch 3 x 30 sec Patient states she was a little sore in low back, offered ice but patient declined.  Encouraged her to use this at home if she remains sore.  11/24/22:  Manual: pelvic floor muscle assessment/ internal - see above for findings. Did benefit from cues for coordination of breathing and pelvic floor contraction. Was able to complete with cues and reps.  Gentle C section scar massage with cues for diaphragmatic breathing and relaxation techniques for improved mobility and decreased tension and gripping at abdomen. Pt very hesitate to tough and mobilize scar herself but was comfortable resting hands at scar for cuing diaphragmatic breathing.     No emotional/communication barriers or cognitive limitation. Patient is motivated to learn. Patient understands and agrees with treatment goals and plan. PT explains patient will be examined in standing, sitting, and lying down to see how their muscles and joints work. When they are ready, they will be asked to remove their underwear so PT can examine their  perineum. The patient is also given the option of providing their own chaperone as one is not provided in our facility. The patient also has the right and is explained the right to defer or refuse any part of the evaluation or treatment including the internal exam. With the patient's consent, PT will use one gloved finger to gently assess the muscles of the pelvic floor, seeing how well it contracts and relaxes and if there is muscle symmetry. After, the patient will get dressed and PT and patient will discuss exam findings and plan of care. PT and patient discuss plan of care, schedule, attendance policy and HEP activities.                  PATIENT EDUCATION:  Education details: [phone] Person educated: Patient Education method: Explanation, Demonstration, Tactile cues, Verbal cues, and Handouts Education comprehension: verbalized understanding and returned demonstration  HOME EXERCISE PROGRAM: Access Code: [email or web address] URL: [email or web address] Date: 11/26/2022 Prepared by: Mikey Kirschner  Exercises - Wall Angels  - 1 x daily - 7 x weekly - 1 sets - 3 reps - 30s holds - Cat Cow  - 1 x daily - 7 x weekly - 1 sets - 10 reps - Supine Diaphragmatic Breathing  - 1 x daily - 7 x weekly - 1 sets - 10 reps - Supine Transversus Abdominis Bracing - Hands on Stomach  - 1 x daily - 7 x weekly - 2 sets - 10 reps - Sidelying Thoracic Rotation  with Open Book  - 1 x daily - 7 x weekly - 1 sets - 3 reps - 30s holds - Supine Hip Internal and External Rotation  - 1 x daily - 7 x weekly - 1 sets - 10 reps - Supine 90/90 Alternating Heel Touches with Posterior Pelvic Tilt  - 1 x daily - 7 x weekly - 2 sets - 10 reps - Supine Dead Bug with Leg Extension  - 1 x daily - 7 x weekly - 2 sets - 10 reps - Standing Hamstring Stretch on Chair  - 1 x daily - 7 x weekly - 1 sets - 3 reps - 30 sec hold - Standing Quad Stretch with Table and Chair Support  - 1 x daily - 7 x weekly - 1 sets - 3 reps - 30  hold ASSESSMENT:  CLINICAL IMPRESSION: pt tolerated session well, had improvements with mobility in surrounding abdominal tissue compared to first assessment of scar site, however does still have great restrictions present and scar itself very limited. Pt tolerated hands on release work and use of suction cup well with more discomfort with suction than hands on per pt, did tolerated it more on Rt side than Lt and started here today for pt tolerance and comfort.  She would benefit from continued skilled PT for LE flexibility and core strengthening.    OBJECTIVE IMPAIRMENTS: decreased coordination, decreased endurance, decreased mobility, decreased strength, increased fascial restrictions, impaired flexibility, impaired sensation, improper body mechanics, postural dysfunction, and pain.   ACTIVITY LIMITATIONS: continence and exercise  PARTICIPATION LIMITATIONS: community activity  PERSONAL FACTORS: Fitness, Time since onset of injury/illness/exacerbation, and 1 comorbidity: x3 c-sections  are also affecting patient's functional outcome.   REHAB POTENTIAL: Good  CLINICAL DECISION MAKING: Evolving/moderate complexity  EVALUATION COMPLEXITY: Moderate   GOALS: Goals reviewed with patient? Yes  SHORT TERM GOALS: Target date: 12/08/22  Pt to be I with HEP.  Baseline: Goal status: INITIAL  2.  Pt to tolerate body weight squats with good technique and pressure management and denies bleeding.  Baseline:  Goal status: INITIAL   LONG TERM GOALS: Target date: 03/12/23  Pt to be I with advanced HEP.  Baseline:  Goal status: INITIAL  2.  Pt will report 50% reduction of pain due to improvements in posture, strength, and muscle length  Baseline: 10/10 at worst Goal status: INITIAL  3.  Pt to demonstrate at least 4/5 pelvic floor strength for improved pelvic stability and decreased strain at pelvic floor/ decrease leakage.  Baseline:  Goal status: INITIAL  4.  Pt to demonstrate improved  coordination of pelvic floor and breathing mechanics with 30# squat with appropriate synergistic patterns to decrease pain and leakage at least 75% of the time.    Baseline:  Goal status: INITIAL  5.  Pt to demonstrate at least 5/5 bil hip strength for improved pelvic stability and functional squats without leakage.  Baseline:  Goal status: INITIAL  6. Pt to report no more than one urinary leakage instance in a month for improved QOL and decreased leakage.  Baseline:  Goal status: INITIAL  PLAN:  PT FREQUENCY: 2x/week  PT DURATION: 10 weeks  PLANNED INTERVENTIONS: Therapeutic exercises, Therapeutic activity, Neuromuscular re-education, Patient/Family education, Self Care, Joint mobilization, DME instructions, Aquatic Therapy, Dry Needling, Spinal mobilization, Cryotherapy, Moist heat, scar mobilization, Taping, Vasopneumatic device, Biofeedback, Ionotophoresis 4mg /ml Dexamethasone, Manual therapy, and Re-evaluation  PLAN FOR NEXT SESSION: Progress core and hip strengthening, coordination of pelvic floor and breathing, flexibility in  back and hips,    Otelia Sergeant, PT, DPT 09/24/241:59 PM   [facility] [address], [address] [place], [place] [address] Phone # [phone] Fax [phone]

## 2022-12-03 ENCOUNTER — Ambulatory Visit: Payer: Medicaid Other

## 2022-12-03 DIAGNOSIS — M6281 Muscle weakness (generalized): Secondary | ICD-10-CM

## 2022-12-03 DIAGNOSIS — R262 Difficulty in walking, not elsewhere classified: Secondary | ICD-10-CM

## 2022-12-03 DIAGNOSIS — M629 Disorder of muscle, unspecified: Secondary | ICD-10-CM | POA: Diagnosis not present

## 2022-12-03 DIAGNOSIS — R252 Cramp and spasm: Secondary | ICD-10-CM

## 2022-12-03 DIAGNOSIS — R293 Abnormal posture: Secondary | ICD-10-CM

## 2022-12-03 NOTE — Therapy (Signed)
OUTPATIENT PHYSICAL THERAPY FEMALE PELVIC EVALUATION   Patient Name: [name] [name] MRN: [id number] DOB:1990/07/16, 32 y.o., female Today's Date: 12/03/2022  END OF SESSION:  PT End of Session - 12/03/22 0945     Visit Number 6    Date for PT Re-Evaluation 03/12/23    Authorization Type [place] MEDICAID [employer]    PT Start Time 0933    PT Stop Time 1014    PT Time Calculation (min) 41 min    Activity Tolerance Patient tolerated treatment well    Behavior During Therapy [facility] for tasks assessed/performed              Past Medical History:  Diagnosis Date   Abnormal Pap smear 03/09/2010   Genital warts    Past Surgical History:  Procedure Laterality Date   CESAREAN SECTION  2009   CESAREAN SECTION  08/12/2011   Procedure: CESAREAN SECTION;  Surgeon: Catalina Antigua, MD;  Location: [facility];  Service: Gynecology;  Laterality: N/A;   CESAREAN SECTION N/A 03/20/2022   Procedure: REPEAT CESAREAN SECTION;  Surgeon: Osborn Coho, MD;  Location: [facility];  Service: Obstetrics;  Laterality: N/A;   DILATION AND CURETTAGE OF UTERUS  2007   TUBAL LIGATION Bilateral 03/20/2022   Procedure: BILATERAL TUBAL LIGATION;  Surgeon: Osborn Coho, MD;  Location: [facility];  Service: Obstetrics;  Laterality: Bilateral;  Ligasure used on both tubes   Patient Active Problem List   Diagnosis Date Noted   Preeclampsia in postpartum period 03/26/2022   PPH (postpartum hemorrhage) 03/23/2022   Status post repeat low transverse cesarean section 03/20/2022   History of miscarriage x 2 03/14/2022   Penicillin allergy 03/14/2022   History of 2 cesarean sections 03/13/2022   Positive GBS test 03/13/2022   LGA (large for gestational age) fetus affecting management of mother 03/13/2022   Family history of hypertrophic cardiomyopathy 12/17/2016   Family history of Wolff-Parkinson-White (WPW) syndrome 12/17/2016    PCP: none per chart  REFERRING PROVIDER: Hands, Kylie,  NP   REFERRING DIAG: M62.9 (ICD-10-CM) - Disorder of muscle, unspecified  THERAPY DIAG:  Muscle weakness (generalized)  Abnormal posture  Cramp and spasm  Difficulty in walking, not elsewhere classified  Rationale for Evaluation and Treatment: Rehabilitation  ONSET DATE: 7 months ago  SUBJECTIVE:                                                                                                                                                                                           SUBJECTIVE STATEMENT: Patient states "unfortunately, my back pain is about the same.  It's tolerable... I wish it wasn't there  but I can tolerate it.  I'm just not able to do my usual workouts because of the bleeding"    Fluid intake: Yes: water - a gallon day, a lot of caffeine right now (2 cups day) usually did not due caffeine .    PAIN:  Are you having pain? Yes NPRS scale: 8/10 Pain location:  low back  Pain type: dull constantly, but increases to shooting type Pain description: constant   Aggravating factors: prolonged sitting, daily activity (at end of day is worse, holding the baby for a while), periods (returned 2 months) Relieving factors: nothing really  PRECAUTIONS: Other: postpartum  RED FLAGS: None   WEIGHT BEARING RESTRICTIONS: No  FALLS:  Has patient fallen in last 6 months? No  LIVING ENVIRONMENT: Lives with: lives with their family Lives in: House/apartment   OCCUPATION: not currently   PLOF: Independent  PATIENT GOALS: to be stronger especially at pelvic floor, I want to get back to consistent strength training.   PERTINENT HISTORY:  Preeclampsia in postpartum period, 3 c-sections Sexual abuse: No  BOWEL MOVEMENT: Pain with bowel movement: Yes Type of bowel movement:Type (Bristol Stool Scale) 4, Frequency daily, and Strain No Fully empty rectum: No Leakage: No Pads: No Fiber supplement: No  URINATION: Pain with urination: No Fully empty bladder: Yes:    Stream: Strong Urgency: Yes:   Frequency: 2x hourly; not really, gets up for baby  and may go to the bathroom.  Leakage: Urge to void Pads: No  INTERCOURSE: Pain with intercourse:  not painful, denies dryness, no bleeding  Ability to have vaginal penetration:  Yes:   Climax: not painful Marinoff Scale: 0/3  PREGNANCY: Vaginal deliveries 0  C-section deliveries 3 Currently pregnant No  PROLAPSE: None   OBJECTIVE:   DIAGNOSTIC FINDINGS:  Ultrasound done last week - (-)  PATIENT SURVEYS:   PFIQ-7 29  COGNITION: Overall cognitive status: Within functional limits for tasks assessed     SENSATION: Light touch: Appears intact Proprioception: Appears intact  MUSCLE LENGTH: Bil hamstrings and adductors limited by 25%   POSTURE: rounded shoulders, forward head, and anterior pelvic tilt  PELVIC ALIGNMENT:  LUMBARAROM/PROM:  A/PROM A/PROM  eval  Flexion WFL  Extension WFL  Right lateral flexion Limited by 25%  Left lateral flexion Limited by 25%  Right rotation Limited by 25%  Left rotation Limited by 25%   (Blank rows = not tested)  LOWER EXTREMITY ROM:  WFL  LOWER EXTREMITY MMT:  Bil hips grossly 4/5; knees 5/5  PALPATION:   General  no TTP, but did have some tension at lumbar spine and glutes C-section scar limited in all quadrants and in all directions.                 External Perineal Exam - no pain, [facility]                             Internal Pelvic Floor no pain  Patient confirms identification and approves PT to assess internal pelvic floor and treatment No  PELVIC MMT:   MMT eval 11/24/22   Vaginal  3/5; 3s; 4x  Internal Anal Sphincter    External Anal Sphincter    Puborectalis    Diastasis Recti <1 finger separation above and below with active crunch    (Blank rows = not tested)        TONE: WFL  PROLAPSE: Not seen in hooklying with cough  TODAY'S TREATMENT:                                                                                                                               DATE:  11/26/22: Bike (Nustep not available) x 5 min level 5 (PT present to discuss status) Hooklying PPT with TA focus and pelvic floor engagement x 20 90/90 heel taps x 20 Dying bug x 20  Hooklying clam with red band with PPT and pelvic floor engagement x 20 Bridge with clam x 10 up and down, then x 10 staying in bridge position Bridge on red physio ball with pelvic floor engagement at top x 20 Bridge on 16" side of box with pelvic floor engagement at top x 20  Hooklying hip IR/ER combo stretch x 10 each side Hooklying trunk rotation x 20 Star Crunch x 20 PPT with ball pass x 10 using red physio ball  Quadruped bird dog x 20 Superman 2 x 10  12/01/22  Scar mobility - gentle scar tissue mobility with fascial release work starting surrounding scar site at c-section scar and progressing from indirect to direct release technique within pt's tolerance, then onto scar site.  Suction cup used after this at surrounding tissue with pt cued for diaphragmatic breathing all above scar site.   11/26/22: Nustep x 5 min level 5 Hooklying PPT with TA focus and pelvic floor engagement x 20 Bridge on red physio ball with pelvic floor engagement at top x 20 Bridge on 16" side of box with pelvic floor engagement at top x 20 Hooklying ball squeeze x 20 with pelvic floor engagement  Hooklying clam with red band with PPT and pelvic floor engagement x 20 Hooklying hip IR/ER combo stretch x 10 each side Hooklying trunk rotation x 20 90/90 heel taps x 20 Dying bug x 20  PPT with ball pass x 10 using red physio ball  Standing hamstring stretch 3 x 30 sec Standing quad/hip flexor stretch 3 x 30 sec Patient states she was a little sore in low back, offered ice but patient declined.  Encouraged her to use this at home if she remains sore.  11/24/22:  Manual: pelvic floor muscle assessment/ internal - see above for findings. Did benefit from cues for  coordination of breathing and pelvic floor contraction. Was able to complete with cues and reps.  Gentle C section scar massage with cues for diaphragmatic breathing and relaxation techniques for improved mobility and decreased tension and gripping at abdomen. Pt very hesitate to tough and mobilize scar herself but was comfortable resting hands at scar for cuing diaphragmatic breathing.     No emotional/communication barriers or cognitive limitation. Patient is motivated to learn. Patient understands and agrees with treatment goals and plan. PT explains patient will be examined in standing, sitting, and lying down to see how their muscles and joints work. When they are ready, they will be asked to remove their underwear so PT can  examine their perineum. The patient is also given the option of providing their own chaperone as one is not provided in our facility. The patient also has the right and is explained the right to defer or refuse any part of the evaluation or treatment including the internal exam. With the patient's consent, PT will use one gloved finger to gently assess the muscles of the pelvic floor, seeing how well it contracts and relaxes and if there is muscle symmetry. After, the patient will get dressed and PT and patient will discuss exam findings and plan of care. PT and patient discuss plan of care, schedule, attendance policy and HEP activities.                  PATIENT EDUCATION:  Education details: [phone] Person educated: Patient Education method: Explanation, Demonstration, Tactile cues, Verbal cues, and Handouts Education comprehension: verbalized understanding and returned demonstration  HOME EXERCISE PROGRAM: Access Code: [email or web address] URL: [email or web address] Date: 11/26/2022 Prepared by: Mikey Kirschner  Exercises - Wall Angels  - 1 x daily - 7 x weekly - 1 sets - 3 reps - 30s holds - Cat Cow  - 1 x daily - 7 x weekly - 1 sets - 10 reps - Supine  Diaphragmatic Breathing  - 1 x daily - 7 x weekly - 1 sets - 10 reps - Supine Transversus Abdominis Bracing - Hands on Stomach  - 1 x daily - 7 x weekly - 2 sets - 10 reps - Sidelying Thoracic Rotation with Open Book  - 1 x daily - 7 x weekly - 1 sets - 3 reps - 30s holds - Supine Hip Internal and External Rotation  - 1 x daily - 7 x weekly - 1 sets - 10 reps - Supine 90/90 Alternating Heel Touches with Posterior Pelvic Tilt  - 1 x daily - 7 x weekly - 2 sets - 10 reps - Supine Dead Bug with Leg Extension  - 1 x daily - 7 x weekly - 2 sets - 10 reps - Standing Hamstring Stretch on Chair  - 1 x daily - 7 x weekly - 1 sets - 3 reps - 30 sec hold - Standing Quad Stretch with Table and Chair Support  - 1 x daily - 7 x weekly - 1 sets - 3 reps - 30 hold ASSESSMENT:  CLINICAL IMPRESSION: Presious is responding very little to current low back regimen.  She had no change at a low level exercise plan so we increased the intensity of her workouts as she had done bootcamp level training prior to pregnancy.  She is scheduled for biopsy of fibroids and endometrium.   This could be contributing to her low back symptoms.  We will continue until results of this.  We discussed possible referral to sports medicine or ortho to see if there might be a medication or injections that would interrupt the pain cycle.  She is very diligent and compliant.  She would benefit from continued skilled PT for LE flexibility and core strengthening.    OBJECTIVE IMPAIRMENTS: decreased coordination, decreased endurance, decreased mobility, decreased strength, increased fascial restrictions, impaired flexibility, impaired sensation, improper body mechanics, postural dysfunction, and pain.   ACTIVITY LIMITATIONS: continence and exercise  PARTICIPATION LIMITATIONS: community activity  PERSONAL FACTORS: Fitness, Time since onset of injury/illness/exacerbation, and 1 comorbidity: x3 c-sections  are also affecting patient's functional  outcome.   REHAB POTENTIAL: Good  CLINICAL DECISION MAKING: Evolving/moderate complexity  EVALUATION COMPLEXITY: Moderate   GOALS: Goals  reviewed with patient? Yes  SHORT TERM GOALS: Target date: 12/08/22  Pt to be I with HEP.  Baseline: Goal status: INITIAL  2.  Pt to tolerate body weight squats with good technique and pressure management and denies bleeding.  Baseline:  Goal status: INITIAL   LONG TERM GOALS: Target date: 03/12/23  Pt to be I with advanced HEP.  Baseline:  Goal status: INITIAL  2.  Pt will report 50% reduction of pain due to improvements in posture, strength, and muscle length  Baseline: 10/10 at worst Goal status: INITIAL  3.  Pt to demonstrate at least 4/5 pelvic floor strength for improved pelvic stability and decreased strain at pelvic floor/ decrease leakage.  Baseline:  Goal status: INITIAL  4.  Pt to demonstrate improved coordination of pelvic floor and breathing mechanics with 30# squat with appropriate synergistic patterns to decrease pain and leakage at least 75% of the time.    Baseline:  Goal status: INITIAL  5.  Pt to demonstrate at least 5/5 bil hip strength for improved pelvic stability and functional squats without leakage.  Baseline:  Goal status: INITIAL  6. Pt to report no more than one urinary leakage instance in a month for improved QOL and decreased leakage.  Baseline:  Goal status: INITIAL  PLAN:  PT FREQUENCY: 2x/week  PT DURATION: 10 weeks  PLANNED INTERVENTIONS: Therapeutic exercises, Therapeutic activity, Neuromuscular re-education, Patient/Family education, Self Care, Joint mobilization, DME instructions, Aquatic Therapy, Dry Needling, Spinal mobilization, Cryotherapy, Moist heat, scar mobilization, Taping, Vasopneumatic device, Biofeedback, Ionotophoresis 4mg /ml Dexamethasone, Manual therapy, and Re-evaluation  PLAN FOR NEXT SESSION: Progress core and hip strengthening, coordination of pelvic floor and breathing,  flexibility in back and hips,    Eydie Wormley B. Lakima Dona, PT 12/03/22 10:16 AM [facility] [address], [address] [place], [place] [address] Phone # [phone] Fax [phone]

## 2022-12-08 ENCOUNTER — Ambulatory Visit: Payer: Medicaid Other | Attending: Obstetrics and Gynecology

## 2022-12-08 DIAGNOSIS — R262 Difficulty in walking, not elsewhere classified: Secondary | ICD-10-CM | POA: Insufficient documentation

## 2022-12-08 DIAGNOSIS — M5459 Other low back pain: Secondary | ICD-10-CM | POA: Diagnosis present

## 2022-12-08 DIAGNOSIS — R252 Cramp and spasm: Secondary | ICD-10-CM | POA: Diagnosis present

## 2022-12-08 DIAGNOSIS — R279 Unspecified lack of coordination: Secondary | ICD-10-CM | POA: Diagnosis present

## 2022-12-08 DIAGNOSIS — R293 Abnormal posture: Secondary | ICD-10-CM | POA: Insufficient documentation

## 2022-12-08 DIAGNOSIS — M6281 Muscle weakness (generalized): Secondary | ICD-10-CM | POA: Insufficient documentation

## 2022-12-08 NOTE — Therapy (Signed)
OUTPATIENT PHYSICAL THERAPY FEMALE PELVIC EVALUATION   Patient Name: [name] [name] MRN: [id number] DOB:08-29-1990, 32 y.o., female Today's Date: 12/08/2022  END OF SESSION:  PT End of Session - 12/08/22 0855     Visit Number 7    Date for PT Re-Evaluation 03/12/23    Authorization Type [place] MEDICAID [employer]    PT Start Time 0850    PT Stop Time 0928    PT Time Calculation (min) 38 min    Activity Tolerance Patient tolerated treatment well    Behavior During Therapy [facility] for tasks assessed/performed              Past Medical History:  Diagnosis Date   Abnormal Pap smear 03/09/2010   Genital warts    Past Surgical History:  Procedure Laterality Date   CESAREAN SECTION  2009   CESAREAN SECTION  08/12/2011   Procedure: CESAREAN SECTION;  Surgeon: Catalina Antigua, MD;  Location: [facility];  Service: Gynecology;  Laterality: N/A;   CESAREAN SECTION N/A 03/20/2022   Procedure: REPEAT CESAREAN SECTION;  Surgeon: Osborn Coho, MD;  Location: [facility];  Service: Obstetrics;  Laterality: N/A;   DILATION AND CURETTAGE OF UTERUS  2007   TUBAL LIGATION Bilateral 03/20/2022   Procedure: BILATERAL TUBAL LIGATION;  Surgeon: Osborn Coho, MD;  Location: [facility];  Service: Obstetrics;  Laterality: Bilateral;  Ligasure used on both tubes   Patient Active Problem List   Diagnosis Date Noted   Preeclampsia in postpartum period 03/26/2022   PPH (postpartum hemorrhage) 03/23/2022   Status post repeat low transverse cesarean section 03/20/2022   History of miscarriage x 2 03/14/2022   Penicillin allergy 03/14/2022   History of 2 cesarean sections 03/13/2022   Positive GBS test 03/13/2022   LGA (large for gestational age) fetus affecting management of mother 03/13/2022   Family history of hypertrophic cardiomyopathy 12/17/2016   Family history of Wolff-Parkinson-White (WPW) syndrome 12/17/2016    PCP: none per chart  REFERRING PROVIDER: Hands, Kylie,  NP   REFERRING DIAG: M62.9 (ICD-10-CM) - Disorder of muscle, unspecified  THERAPY DIAG:  Difficulty in walking, not elsewhere classified  Muscle weakness (generalized)  Abnormal posture  Cramp and spasm  Rationale for Evaluation and Treatment: Rehabilitation  ONSET DATE: 7 months ago  SUBJECTIVE:                                                                                                                                                                                           SUBJECTIVE STATEMENT: Patient states her pain is around 3/10. "Still just low back aching" "I was on my period this  week so unable to tell if any breakthrough bleeding".  Patient plans on beginning her workouts next week to see if any bleeding occurs.    Fluid intake: Yes: water - a gallon day, a lot of caffeine right now (2 cups day) usually did not due caffeine .    PAIN:  Are you having pain? Yes NPRS scale: 8/10 Pain location:  low back  Pain type: dull constantly, but increases to shooting type Pain description: constant   Aggravating factors: prolonged sitting, daily activity (at end of day is worse, holding the baby for a while), periods (returned 2 months) Relieving factors: nothing really  PRECAUTIONS: Other: postpartum  RED FLAGS: None   WEIGHT BEARING RESTRICTIONS: No  FALLS:  Has patient fallen in last 6 months? No  LIVING ENVIRONMENT: Lives with: lives with their family Lives in: House/apartment   OCCUPATION: not currently   PLOF: Independent  PATIENT GOALS: to be stronger especially at pelvic floor, I want to get back to consistent strength training.   PERTINENT HISTORY:  Preeclampsia in postpartum period, 3 c-sections Sexual abuse: No  BOWEL MOVEMENT: Pain with bowel movement: Yes Type of bowel movement:Type (Bristol Stool Scale) 4, Frequency daily, and Strain No Fully empty rectum: No Leakage: No Pads: No Fiber supplement: No  URINATION: Pain with urination:  No Fully empty bladder: Yes:   Stream: Strong Urgency: Yes:   Frequency: 2x hourly; not really, gets up for baby  and may go to the bathroom.  Leakage: Urge to void Pads: No  INTERCOURSE: Pain with intercourse:  not painful, denies dryness, no bleeding  Ability to have vaginal penetration:  Yes:   Climax: not painful Marinoff Scale: 0/3  PREGNANCY: Vaginal deliveries 0  C-section deliveries 3 Currently pregnant No  PROLAPSE: None   OBJECTIVE:   DIAGNOSTIC FINDINGS:  Ultrasound done last week - (-)  PATIENT SURVEYS:   PFIQ-7 29  COGNITION: Overall cognitive status: Within functional limits for tasks assessed     SENSATION: Light touch: Appears intact Proprioception: Appears intact  MUSCLE LENGTH: Bil hamstrings and adductors limited by 25%   POSTURE: rounded shoulders, forward head, and anterior pelvic tilt  PELVIC ALIGNMENT:  LUMBARAROM/PROM:  A/PROM A/PROM  eval  Flexion WFL  Extension WFL  Right lateral flexion Limited by 25%  Left lateral flexion Limited by 25%  Right rotation Limited by 25%  Left rotation Limited by 25%   (Blank rows = not tested)  LOWER EXTREMITY ROM:  WFL  LOWER EXTREMITY MMT:  Bil hips grossly 4/5; knees 5/5  PALPATION:   General  no TTP, but did have some tension at lumbar spine and glutes C-section scar limited in all quadrants and in all directions.                 External Perineal Exam - no pain, [facility]                             Internal Pelvic Floor no pain  Patient confirms identification and approves PT to assess internal pelvic floor and treatment No  PELVIC MMT:   MMT eval 11/24/22   Vaginal  3/5; 3s; 4x  Internal Anal Sphincter    External Anal Sphincter    Puborectalis    Diastasis Recti <1 finger separation above and below with active crunch    (Blank rows = not tested)        TONE: WFL  PROLAPSE: Not seen in  hooklying with cough   TODAY'S TREATMENT:                                                                                                                               DATE:  12/08/22: Bike (Nustep not available) x 5 min level 5 (PT present to discuss status) Hooklying PPT with TA focus and pelvic floor engagement x 20 90/90 heel taps x 20 Dying bug x 20  Hooklying clam with red band with PPT and pelvic floor engagement x 20 Bridge with clam x 10 up and down, then x 10 staying in bridge position Bridge on red physio ball with pelvic floor engagement at top x 20 Bridge on 16" side of ritfit box with pelvic floor engagement at top x 20  Hooklying hip IR/ER combo stretch x 10 each side Hooklying trunk rotation x 20 Star Crunch x 20 (verbal cues for straightening knees) PPT with ball pass x 10 using red physio ball  Quadruped bird dog x 20 Superman 2 x 10 Standing hamstring stretch 3 x 30 sec Standing quad/hip flexor stretch 3 x 30 sec  12/01/22  Scar mobility - gentle scar tissue mobility with fascial release work starting surrounding scar site at c-section scar and progressing from indirect to direct release technique within pt's tolerance, then onto scar site.  Suction cup used after this at surrounding tissue with pt cued for diaphragmatic breathing all above scar site.   11/26/22: Nustep x 5 min level 5 Hooklying PPT with TA focus and pelvic floor engagement x 20 Bridge on red physio ball with pelvic floor engagement at top x 20 Bridge on 16" side of box with pelvic floor engagement at top x 20 Hooklying ball squeeze x 20 with pelvic floor engagement  Hooklying clam with red band with PPT and pelvic floor engagement x 20 Hooklying hip IR/ER combo stretch x 10 each side Hooklying trunk rotation x 20 90/90 heel taps x 20 Dying bug x 20  PPT with ball pass x 10 using red physio ball  Standing hamstring stretch 3 x 30 sec Standing quad/hip flexor stretch 3 x 30 sec Patient states she was a little sore in low back, offered ice but patient declined.  Encouraged her  to use this at home if she remains sore.  11/24/22:  Manual: pelvic floor muscle assessment/ internal - see above for findings. Did benefit from cues for coordination of breathing and pelvic floor contraction. Was able to complete with cues and reps.  Gentle C section scar massage with cues for diaphragmatic breathing and relaxation techniques for improved mobility and decreased tension and gripping at abdomen. Pt very hesitate to tough and mobilize scar herself but was comfortable resting hands at scar for cuing diaphragmatic breathing.     No emotional/communication barriers or cognitive limitation. Patient is motivated to learn. Patient understands and agrees with treatment goals and plan. PT explains patient will be examined in standing, sitting,  and lying down to see how their muscles and joints work. When they are ready, they will be asked to remove their underwear so PT can examine their perineum. The patient is also given the option of providing their own chaperone as one is not provided in our facility. The patient also has the right and is explained the right to defer or refuse any part of the evaluation or treatment including the internal exam. With the patient's consent, PT will use one gloved finger to gently assess the muscles of the pelvic floor, seeing how well it contracts and relaxes and if there is muscle symmetry. After, the patient will get dressed and PT and patient will discuss exam findings and plan of care. PT and patient discuss plan of care, schedule, attendance policy and HEP activities.                  PATIENT EDUCATION:  Education details: [phone] Person educated: Patient Education method: Explanation, Demonstration, Tactile cues, Verbal cues, and Handouts Education comprehension: verbalized understanding and returned demonstration  HOME EXERCISE PROGRAM: Access Code: [email or web address] URL: [email or web address] Date: 11/26/2022 Prepared by: Mikey Kirschner  Exercises - Wall Angels  - 1 x daily - 7 x weekly - 1 sets - 3 reps - 30s holds - Cat Cow  - 1 x daily - 7 x weekly - 1 sets - 10 reps - Supine Diaphragmatic Breathing  - 1 x daily - 7 x weekly - 1 sets - 10 reps - Supine Transversus Abdominis Bracing - Hands on Stomach  - 1 x daily - 7 x weekly - 2 sets - 10 reps - Sidelying Thoracic Rotation with Open Book  - 1 x daily - 7 x weekly - 1 sets - 3 reps - 30s holds - Supine Hip Internal and External Rotation  - 1 x daily - 7 x weekly - 1 sets - 10 reps - Supine 90/90 Alternating Heel Touches with Posterior Pelvic Tilt  - 1 x daily - 7 x weekly - 2 sets - 10 reps - Supine Dead Bug with Leg Extension  - 1 x daily - 7 x weekly - 2 sets - 10 reps - Standing Hamstring Stretch on Chair  - 1 x daily - 7 x weekly - 1 sets - 3 reps - 30 sec hold - Standing Quad Stretch with Table and Chair Support  - 1 x daily - 7 x weekly - 1 sets - 3 reps - 30 hold ASSESSMENT:  CLINICAL IMPRESSION: November is able to tolerate increasing level of difficulty and with less fatigue but low back aching persists.  This is reported at a low range at around 2-3/10.  She is compliant and well motivated.    She would benefit from continued skilled PT for LE flexibility and core strengthening.    OBJECTIVE IMPAIRMENTS: decreased coordination, decreased endurance, decreased mobility, decreased strength, increased fascial restrictions, impaired flexibility, impaired sensation, improper body mechanics, postural dysfunction, and pain.   ACTIVITY LIMITATIONS: continence and exercise  PARTICIPATION LIMITATIONS: community activity  PERSONAL FACTORS: Fitness, Time since onset of injury/illness/exacerbation, and 1 comorbidity: x3 c-sections  are also affecting patient's functional outcome.   REHAB POTENTIAL: Good  CLINICAL DECISION MAKING: Evolving/moderate complexity  EVALUATION COMPLEXITY: Moderate   GOALS: Goals reviewed with patient? Yes  SHORT TERM GOALS: Target  date: 12/08/22  Pt to be I with HEP.  Baseline: Goal status: INITIAL  2.  Pt to tolerate body weight squats with good technique and pressure  management and denies bleeding.  Baseline:  Goal status: INITIAL   LONG TERM GOALS: Target date: 03/12/23  Pt to be I with advanced HEP.  Baseline:  Goal status: INITIAL  2.  Pt will report 50% reduction of pain due to improvements in posture, strength, and muscle length  Baseline: 10/10 at worst Goal status: INITIAL  3.  Pt to demonstrate at least 4/5 pelvic floor strength for improved pelvic stability and decreased strain at pelvic floor/ decrease leakage.  Baseline:  Goal status: INITIAL  4.  Pt to demonstrate improved coordination of pelvic floor and breathing mechanics with 30# squat with appropriate synergistic patterns to decrease pain and leakage at least 75% of the time.    Baseline:  Goal status: INITIAL  5.  Pt to demonstrate at least 5/5 bil hip strength for improved pelvic stability and functional squats without leakage.  Baseline:  Goal status: INITIAL  6. Pt to report no more than one urinary leakage instance in a month for improved QOL and decreased leakage.  Baseline:  Goal status: INITIAL  PLAN:  PT FREQUENCY: 2x/week  PT DURATION: 10 weeks  PLANNED INTERVENTIONS: Therapeutic exercises, Therapeutic activity, Neuromuscular re-education, Patient/Family education, Self Care, Joint mobilization, DME instructions, Aquatic Therapy, Dry Needling, Spinal mobilization, Cryotherapy, Moist heat, scar mobilization, Taping, Vasopneumatic device, Biofeedback, Ionotophoresis 4mg /ml Dexamethasone, Manual therapy, and Re-evaluation  PLAN FOR NEXT SESSION: Continue to progress core and hip strengthening, coordination of pelvic floor and breathing, flexibility in back and hips,   Siddh Vandeventer B. Henrietta Cieslewicz, PT 12/08/22 9:36 AM [facility] [address], [address] [place], [place] [address] Phone #  [phone] Fax [phone]

## 2022-12-09 ENCOUNTER — Ambulatory Visit: Payer: Medicaid Other | Admitting: Physical Therapy

## 2022-12-09 DIAGNOSIS — R293 Abnormal posture: Secondary | ICD-10-CM

## 2022-12-09 DIAGNOSIS — R262 Difficulty in walking, not elsewhere classified: Secondary | ICD-10-CM | POA: Diagnosis not present

## 2022-12-09 DIAGNOSIS — R279 Unspecified lack of coordination: Secondary | ICD-10-CM

## 2022-12-09 DIAGNOSIS — M6281 Muscle weakness (generalized): Secondary | ICD-10-CM

## 2022-12-09 NOTE — Therapy (Signed)
OUTPATIENT PHYSICAL THERAPY FEMALE PELVIC EVALUATION   Patient Name: [name] [name] MRN: [id number] DOB:1990-09-30, 32 y.o., female Today's Date: 12/09/2022  END OF SESSION:  PT End of Session - 12/09/22 1102     Visit Number 8    Date for PT Re-Evaluation 03/12/23    Authorization Type [place] MEDICAID [employer]    PT Start Time 1100    PT Stop Time 1142    PT Time Calculation (min) 42 min    Activity Tolerance Patient tolerated treatment well    Behavior During Therapy [facility] for tasks assessed/performed              Past Medical History:  Diagnosis Date   Abnormal Pap smear 03/09/2010   Genital warts    Past Surgical History:  Procedure Laterality Date   CESAREAN SECTION  2009   CESAREAN SECTION  08/12/2011   Procedure: CESAREAN SECTION;  Surgeon: Catalina Antigua, MD;  Location: [facility];  Service: Gynecology;  Laterality: N/A;   CESAREAN SECTION N/A 03/20/2022   Procedure: REPEAT CESAREAN SECTION;  Surgeon: Osborn Coho, MD;  Location: [facility];  Service: Obstetrics;  Laterality: N/A;   DILATION AND CURETTAGE OF UTERUS  2007   TUBAL LIGATION Bilateral 03/20/2022   Procedure: BILATERAL TUBAL LIGATION;  Surgeon: Osborn Coho, MD;  Location: [facility];  Service: Obstetrics;  Laterality: Bilateral;  Ligasure used on both tubes   Patient Active Problem List   Diagnosis Date Noted   Preeclampsia in postpartum period 03/26/2022   PPH (postpartum hemorrhage) 03/23/2022   Status post repeat low transverse cesarean section 03/20/2022   History of miscarriage x 2 03/14/2022   Penicillin allergy 03/14/2022   History of 2 cesarean sections 03/13/2022   Positive GBS test 03/13/2022   LGA (large for gestational age) fetus affecting management of mother 03/13/2022   Family history of hypertrophic cardiomyopathy 12/17/2016   Family history of Wolff-Parkinson-White (WPW) syndrome 12/17/2016    PCP: none per chart  REFERRING PROVIDER: Hands, Kylie,  NP   REFERRING DIAG: M62.9 (ICD-10-CM) - Disorder of muscle, unspecified  THERAPY DIAG:  Muscle weakness (generalized)  Abnormal posture  Unspecified lack of coordination  Rationale for Evaluation and Treatment: Rehabilitation  ONSET DATE: 7 months ago  SUBJECTIVE:                                                                                                                                                                                           SUBJECTIVE STATEMENT: Pt reports no longer having spotting but hasn't attempted heavier lifting yet.   Fluid intake: Yes: water - a gallon day, a lot  of caffeine right now (2 cups day) usually did not due caffeine .    PAIN:  Are you having pain? Yes NPRS scale: 4/10 Pain location:  low back  Pain type: dull constantly, but increases to shooting type Pain description: constant   Aggravating factors: prolonged sitting, daily activity (at end of day is worse, holding the baby for a while), periods (returned 2 months) Relieving factors: nothing really  PRECAUTIONS: Other: postpartum  RED FLAGS: None   WEIGHT BEARING RESTRICTIONS: No  FALLS:  Has patient fallen in last 6 months? No  LIVING ENVIRONMENT: Lives with: lives with their family Lives in: House/apartment   OCCUPATION: not currently   PLOF: Independent  PATIENT GOALS: to be stronger especially at pelvic floor, I want to get back to consistent strength training.   PERTINENT HISTORY:  Preeclampsia in postpartum period, 3 c-sections Sexual abuse: No  BOWEL MOVEMENT: Pain with bowel movement: Yes Type of bowel movement:Type (Bristol Stool Scale) 4, Frequency daily, and Strain No Fully empty rectum: No Leakage: No Pads: No Fiber supplement: No  URINATION: Pain with urination: No Fully empty bladder: Yes:   Stream: Strong Urgency: Yes:   Frequency: 2x hourly; not really, gets up for baby  and may go to the bathroom.  Leakage: Urge to void Pads:  No  INTERCOURSE: Pain with intercourse:  not painful, denies dryness, no bleeding  Ability to have vaginal penetration:  Yes:   Climax: not painful Marinoff Scale: 0/3  PREGNANCY: Vaginal deliveries 0  C-section deliveries 3 Currently pregnant No  PROLAPSE: None   OBJECTIVE:   DIAGNOSTIC FINDINGS:  Ultrasound done last week - (-)  PATIENT SURVEYS:   PFIQ-7 29  COGNITION: Overall cognitive status: Within functional limits for tasks assessed     SENSATION: Light touch: Appears intact Proprioception: Appears intact  MUSCLE LENGTH: Bil hamstrings and adductors limited by 25%   POSTURE: rounded shoulders, forward head, and anterior pelvic tilt  PELVIC ALIGNMENT:  LUMBARAROM/PROM:  A/PROM A/PROM  eval  Flexion WFL  Extension WFL  Right lateral flexion Limited by 25%  Left lateral flexion Limited by 25%  Right rotation Limited by 25%  Left rotation Limited by 25%   (Blank rows = not tested)  LOWER EXTREMITY ROM:  WFL  LOWER EXTREMITY MMT:  Bil hips grossly 4/5; knees 5/5  PALPATION:   General  no TTP, but did have some tension at lumbar spine and glutes C-section scar limited in all quadrants and in all directions.                 External Perineal Exam - no pain, [facility]                             Internal Pelvic Floor no pain  Patient confirms identification and approves PT to assess internal pelvic floor and treatment No  PELVIC MMT:   MMT eval 11/24/22   Vaginal  3/5; 3s; 4x  Internal Anal Sphincter    External Anal Sphincter    Puborectalis    Diastasis Recti <1 finger separation above and below with active crunch    (Blank rows = not tested)        TONE: WFL  PROLAPSE: Not seen in hooklying with cough   TODAY'S TREATMENT:  DATE:  12/09/22: Manual - Scar mobility - gentle scar tissue mobility with fascial  release work starting surrounding scar site at c-section scar and  direct release technique within pt's tolerance, then onto scar site. Suction cup used after this at surrounding tissue with pt cued for diaphragmatic breathing all above scar site. Midline scar site and mid abdominal quadrant is most tight today though surrounding tissue continues to be tight, but improved. Pt tolerated well with cues for decreased chest breathing and improved mobility with diaphragmatic breathing. Suction cup also used just above scar line with x5 diaphragmatic breathing each quadrant for improved mobility.   Therapeutic exercise: Mini cobra 2x30s Marjo Bicker pose with 360 breathing 3x30s Side bending childs pose 3x30s    12/08/22: Bike (Nustep not available) x 5 min level 5 (PT present to discuss status) Hooklying PPT with TA focus and pelvic floor engagement x 20 90/90 heel taps x 20 Dying bug x 20  Hooklying clam with red band with PPT and pelvic floor engagement x 20 Bridge with clam x 10 up and down, then x 10 staying in bridge position Bridge on red physio ball with pelvic floor engagement at top x 20 Bridge on 16" side of ritfit box with pelvic floor engagement at top x 20  Hooklying hip IR/ER combo stretch x 10 each side Hooklying trunk rotation x 20 Star Crunch x 20 (verbal cues for straightening knees) PPT with ball pass x 10 using red physio ball  Quadruped bird dog x 20 Superman 2 x 10 Standing hamstring stretch 3 x 30 sec Standing quad/hip flexor stretch 3 x 30 sec  12/01/22  Scar mobility - gentle scar tissue mobility with fascial release work starting surrounding scar site at c-section scar and progressing from indirect to direct release technique within pt's tolerance, then onto scar site.  Suction cup used after this at surrounding tissue with pt cued for diaphragmatic breathing all above scar site.   No emotional/communication barriers or cognitive limitation. Patient is motivated to learn.  Patient understands and agrees with treatment goals and plan. PT explains patient will be examined in standing, sitting, and lying down to see how their muscles and joints work. When they are ready, they will be asked to remove their underwear so PT can examine their perineum. The patient is also given the option of providing their own chaperone as one is not provided in our facility. The patient also has the right and is explained the right to defer or refuse any part of the evaluation or treatment including the internal exam. With the patient's consent, PT will use one gloved finger to gently assess the muscles of the pelvic floor, seeing how well it contracts and relaxes and if there is muscle symmetry. After, the patient will get dressed and PT and patient will discuss exam findings and plan of care. PT and patient discuss plan of care, schedule, attendance policy and HEP activities.                  PATIENT EDUCATION:  Education details: [phone] Person educated: Patient Education method: Explanation, Demonstration, Tactile cues, Verbal cues, and Handouts Education comprehension: verbalized understanding and returned demonstration  HOME EXERCISE PROGRAM: Access Code: [email or web address] URL: [email or web address] Date: 11/26/2022 Prepared by: Mikey Kirschner  Exercises - Wall Angels  - 1 x daily - 7 x weekly - 1 sets - 3 reps - 30s holds - Cat Cow  - 1 x daily - 7 x weekly - 1 sets -  10 reps - Supine Diaphragmatic Breathing  - 1 x daily - 7 x weekly - 1 sets - 10 reps - Supine Transversus Abdominis Bracing - Hands on Stomach  - 1 x daily - 7 x weekly - 2 sets - 10 reps - Sidelying Thoracic Rotation with Open Book  - 1 x daily - 7 x weekly - 1 sets - 3 reps - 30s holds - Supine Hip Internal and External Rotation  - 1 x daily - 7 x weekly - 1 sets - 10 reps - Supine 90/90 Alternating Heel Touches with Posterior Pelvic Tilt  - 1 x daily - 7 x weekly - 2 sets - 10 reps - Supine Dead Bug  with Leg Extension  - 1 x daily - 7 x weekly - 2 sets - 10 reps - Standing Hamstring Stretch on Chair  - 1 x daily - 7 x weekly - 1 sets - 3 reps - 30 sec hold - Standing Quad Stretch with Table and Chair Support  - 1 x daily - 7 x weekly - 1 sets - 3 reps - 30 hold ASSESSMENT:  CLINICAL IMPRESSION: pt reports her back pain has been lower this week but still present, scar site remain very tight but improving consistently. Pt tolerated session well with tenderness with manual work over scar site especially at lt side of scar. Pt also no longer having spotting with working out but decreased heaviness at home. Pt continues to have back pain, limitation in mobility due to scar restrictions, and decreased hip and core strength, continued PT recommended to address these deficits.   OBJECTIVE IMPAIRMENTS: decreased coordination, decreased endurance, decreased mobility, decreased strength, increased fascial restrictions, impaired flexibility, impaired sensation, improper body mechanics, postural dysfunction, and pain.   ACTIVITY LIMITATIONS: continence and exercise  PARTICIPATION LIMITATIONS: community activity  PERSONAL FACTORS: Fitness, Time since onset of injury/illness/exacerbation, and 1 comorbidity: x3 c-sections  are also affecting patient's functional outcome.   REHAB POTENTIAL: Good  CLINICAL DECISION MAKING: Evolving/moderate complexity  EVALUATION COMPLEXITY: Moderate   GOALS: Goals reviewed with patient? Yes  SHORT TERM GOALS: Target date: 12/08/22  Pt to be I with HEP.  Baseline: Goal status: INITIAL  2.  Pt to tolerate body weight squats with good technique and pressure management and denies bleeding.  Baseline:  Goal status: INITIAL   LONG TERM GOALS: Target date: 03/12/23  Pt to be I with advanced HEP.  Baseline:  Goal status: INITIAL  2.  Pt will report 50% reduction of pain due to improvements in posture, strength, and muscle length  Baseline: 10/10 at worst Goal  status: INITIAL  3.  Pt to demonstrate at least 4/5 pelvic floor strength for improved pelvic stability and decreased strain at pelvic floor/ decrease leakage.  Baseline:  Goal status: INITIAL  4.  Pt to demonstrate improved coordination of pelvic floor and breathing mechanics with 30# squat with appropriate synergistic patterns to decrease pain and leakage at least 75% of the time.    Baseline:  Goal status: INITIAL  5.  Pt to demonstrate at least 5/5 bil hip strength for improved pelvic stability and functional squats without leakage.  Baseline:  Goal status: INITIAL  6. Pt to report no more than one urinary leakage instance in a month for improved QOL and decreased leakage.  Baseline:  Goal status: INITIAL  PLAN:  PT FREQUENCY: 2x/week  PT DURATION: 10 weeks  PLANNED INTERVENTIONS: Therapeutic exercises, Therapeutic activity, Neuromuscular re-education, Patient/Family education, Self Care, Joint mobilization,  DME instructions, Aquatic Therapy, Dry Needling, Spinal mobilization, Cryotherapy, Moist heat, scar mobilization, Taping, Vasopneumatic device, Biofeedback, Ionotophoresis 4mg /ml Dexamethasone, Manual therapy, and Re-evaluation  PLAN FOR NEXT SESSION: Continue to progress core and hip strengthening, coordination of pelvic floor and breathing, flexibility in back and hips,   Otelia Sergeant, PT, DPT 12/09/2410:43 PM  [facility] [address], [address] [place], [place] [address] Phone # [phone] Fax [phone]

## 2022-12-10 ENCOUNTER — Ambulatory Visit: Payer: Medicaid Other

## 2022-12-10 DIAGNOSIS — R252 Cramp and spasm: Secondary | ICD-10-CM

## 2022-12-10 DIAGNOSIS — R262 Difficulty in walking, not elsewhere classified: Secondary | ICD-10-CM

## 2022-12-10 DIAGNOSIS — M6281 Muscle weakness (generalized): Secondary | ICD-10-CM

## 2022-12-10 NOTE — Therapy (Signed)
OUTPATIENT PHYSICAL THERAPY FEMALE PELVIC EVALUATION   Patient Name: [name] [name] MRN: [id number] DOB:1990/11/01, 32 y.o., female Today's Date: 12/10/2022  END OF SESSION:  PT End of Session - 12/10/22 0900     Visit Number 9    Date for PT Re-Evaluation 03/12/23    Authorization Type [place] MEDICAID [employer]    PT Start Time 0845    PT Stop Time 0931    PT Time Calculation (min) 46 min    Activity Tolerance Patient tolerated treatment well    Behavior During Therapy [facility] for tasks assessed/performed              Past Medical History:  Diagnosis Date   Abnormal Pap smear 03/09/2010   Genital warts    Past Surgical History:  Procedure Laterality Date   CESAREAN SECTION  2009   CESAREAN SECTION  08/12/2011   Procedure: CESAREAN SECTION;  Surgeon: Catalina Antigua, MD;  Location: [facility];  Service: Gynecology;  Laterality: N/A;   CESAREAN SECTION N/A 03/20/2022   Procedure: REPEAT CESAREAN SECTION;  Surgeon: Osborn Coho, MD;  Location: [facility];  Service: Obstetrics;  Laterality: N/A;   DILATION AND CURETTAGE OF UTERUS  2007   TUBAL LIGATION Bilateral 03/20/2022   Procedure: BILATERAL TUBAL LIGATION;  Surgeon: Osborn Coho, MD;  Location: [facility];  Service: Obstetrics;  Laterality: Bilateral;  Ligasure used on both tubes   Patient Active Problem List   Diagnosis Date Noted   Preeclampsia in postpartum period 03/26/2022   PPH (postpartum hemorrhage) 03/23/2022   Status post repeat low transverse cesarean section 03/20/2022   History of miscarriage x 2 03/14/2022   Penicillin allergy 03/14/2022   History of 2 cesarean sections 03/13/2022   Positive GBS test 03/13/2022   LGA (large for gestational age) fetus affecting management of mother 03/13/2022   Family history of hypertrophic cardiomyopathy 12/17/2016   Family history of Wolff-Parkinson-White (WPW) syndrome 12/17/2016    PCP: none per chart  REFERRING PROVIDER: Hands, Kylie,  NP   REFERRING DIAG: M62.9 (ICD-10-CM) - Disorder of muscle, unspecified  THERAPY DIAG:  Muscle weakness (generalized)  Cramp and spasm  Difficulty in walking, not elsewhere classified  Rationale for Evaluation and Treatment: Rehabilitation  ONSET DATE: 7 months ago  SUBJECTIVE:                                                                                                                                                                                           SUBJECTIVE STATEMENT: Pt reports she started some of her lifting yesterday.  No breakthrough bleeding so far.    Fluid intake: Yes:  water - a gallon day, a lot of caffeine right now (2 cups day) usually did not due caffeine .    PAIN:  Are you having pain? Yes NPRS scale: 4/10 Pain location:  low back  Pain type: dull constantly, but increases to shooting type Pain description: constant   Aggravating factors: prolonged sitting, daily activity (at end of day is worse, holding the baby for a while), periods (returned 2 months) Relieving factors: nothing really  PRECAUTIONS: Other: postpartum  RED FLAGS: None   WEIGHT BEARING RESTRICTIONS: No  FALLS:  Has patient fallen in last 6 months? No  LIVING ENVIRONMENT: Lives with: lives with their family Lives in: House/apartment   OCCUPATION: not currently   PLOF: Independent  PATIENT GOALS: to be stronger especially at pelvic floor, I want to get back to consistent strength training.   PERTINENT HISTORY:  Preeclampsia in postpartum period, 3 c-sections Sexual abuse: No  BOWEL MOVEMENT: Pain with bowel movement: Yes Type of bowel movement:Type (Bristol Stool Scale) 4, Frequency daily, and Strain No Fully empty rectum: No Leakage: No Pads: No Fiber supplement: No  URINATION: Pain with urination: No Fully empty bladder: Yes:   Stream: Strong Urgency: Yes:   Frequency: 2x hourly; not really, gets up for baby  and may go to the bathroom.  Leakage: Urge  to void Pads: No  INTERCOURSE: Pain with intercourse:  not painful, denies dryness, no bleeding  Ability to have vaginal penetration:  Yes:   Climax: not painful Marinoff Scale: 0/3  PREGNANCY: Vaginal deliveries 0  C-section deliveries 3 Currently pregnant No  PROLAPSE: None   OBJECTIVE:   DIAGNOSTIC FINDINGS:  Ultrasound done last week - (-)  PATIENT SURVEYS:   PFIQ-7 29  COGNITION: Overall cognitive status: Within functional limits for tasks assessed     SENSATION: Light touch: Appears intact Proprioception: Appears intact  MUSCLE LENGTH: Bil hamstrings and adductors limited by 25%   POSTURE: rounded shoulders, forward head, and anterior pelvic tilt  PELVIC ALIGNMENT:  LUMBARAROM/PROM:  A/PROM A/PROM  eval  Flexion WFL  Extension WFL  Right lateral flexion Limited by 25%  Left lateral flexion Limited by 25%  Right rotation Limited by 25%  Left rotation Limited by 25%   (Blank rows = not tested)  LOWER EXTREMITY ROM:  WFL  LOWER EXTREMITY MMT:  Bil hips grossly 4/5; knees 5/5  PALPATION:   General  no TTP, but did have some tension at lumbar spine and glutes C-section scar limited in all quadrants and in all directions.                 External Perineal Exam - no pain, [facility]                             Internal Pelvic Floor no pain  Patient confirms identification and approves PT to assess internal pelvic floor and treatment No  PELVIC MMT:   MMT eval 11/24/22   Vaginal  3/5; 3s; 4x  Internal Anal Sphincter    External Anal Sphincter    Puborectalis    Diastasis Recti <1 finger separation above and below with active crunch    (Blank rows = not tested)        TONE: WFL  PROLAPSE: Not seen in hooklying with cough   TODAY'S TREATMENT:  DATE:  12/10/22: Nustep  x 5 min level 5 (PT present to discuss  status) Squats with 5 lb kb x 10 (with instruction to breathe out on the downward motion to engage pelvic floor) Power squats with 5 lb kb x 10 (with instruction to breathe out on the downward motion to engage pelvic floor) Backward and fwd hops x 10 each way (with instruction to breathe out on the downward motion to engage pelvic floor) Hooklying PPT with TA focus and pelvic floor engagement x 20 90/90 heel taps x 20 Dying bug x 20  Hooklying clam with black loop with PPT and pelvic floor engagement x 20 Bridge with clam x 10 up and down, then x 10 staying in bridge position Side lying single leg clam x 20 with black loop Bridge on red physio ball with pelvic floor engagement at top x 20 Bridge on 16" side of ritfit box with pelvic floor engagement at top x 20 Hooklying hip IR/ER combo stretch x 10 each side Hooklying trunk rotation x 20 Star Crunch x 20 (verbal cues for straightening knees) PPT with ball pass x 10 using red physio ball  Quadruped bird dog x 20 Superman 2 x 10 Standing hamstring stretch 3 x 30 sec Standing quad/hip flexor stretch 3 x 30 sec  12/09/22: Manual - Scar mobility - gentle scar tissue mobility with fascial release work starting surrounding scar site at c-section scar and  direct release technique within pt's tolerance, then onto scar site. Suction cup used after this at surrounding tissue with pt cued for diaphragmatic breathing all above scar site. Midline scar site and mid abdominal quadrant is most tight today though surrounding tissue continues to be tight, but improved. Pt tolerated well with cues for decreased chest breathing and improved mobility with diaphragmatic breathing. Suction cup also used just above scar line with x5 diaphragmatic breathing each quadrant for improved mobility.   Therapeutic exercise: Mini cobra 2x30s Marjo Bicker pose with 360 breathing 3x30s Side bending childs pose 3x30s    12/08/22: Bike (Nustep not available) x 5 min level 5 (PT  present to discuss status) Hooklying PPT with TA focus and pelvic floor engagement x 20 90/90 heel taps x 20 Dying bug x 20  Hooklying clam with red band with PPT and pelvic floor engagement x 20 Bridge with clam x 10 up and down, then x 10 staying in bridge position Bridge on red physio ball with pelvic floor engagement at top x 20 Bridge on 16" side of ritfit box with pelvic floor engagement at top x 20  Hooklying hip IR/ER combo stretch x 10 each side Hooklying trunk rotation x 20 Star Crunch x 20 (verbal cues for straightening knees) PPT with ball pass x 10 using red physio ball  Quadruped bird dog x 20 Superman 2 x 10 Standing hamstring stretch 3 x 30 sec Standing quad/hip flexor stretch 3 x 30 sec  12/01/22  Scar mobility - gentle scar tissue mobility with fascial release work starting surrounding scar site at c-section scar and progressing from indirect to direct release technique within pt's tolerance, then onto scar site.  Suction cup used after this at surrounding tissue with pt cued for diaphragmatic breathing all above scar site.   No emotional/communication barriers or cognitive limitation. Patient is motivated to learn. Patient understands and agrees with treatment goals and plan. PT explains patient will be examined in standing, sitting, and lying down to see how their muscles and joints work. When they are  ready, they will be asked to remove their underwear so PT can examine their perineum. The patient is also given the option of providing their own chaperone as one is not provided in our facility. The patient also has the right and is explained the right to defer or refuse any part of the evaluation or treatment including the internal exam. With the patient's consent, PT will use one gloved finger to gently assess the muscles of the pelvic floor, seeing how well it contracts and relaxes and if there is muscle symmetry. After, the patient will get dressed and PT and patient will  discuss exam findings and plan of care. PT and patient discuss plan of care, schedule, attendance policy and HEP activities.                  PATIENT EDUCATION:  Education details: [phone] Person educated: Patient Education method: Explanation, Demonstration, Tactile cues, Verbal cues, and Handouts Education comprehension: verbalized understanding and returned demonstration  HOME EXERCISE PROGRAM: Access Code: [email or web address] URL: [email or web address] Date: 11/26/2022 Prepared by: Mikey Kirschner  Exercises - Wall Angels  - 1 x daily - 7 x weekly - 1 sets - 3 reps - 30s holds - Cat Cow  - 1 x daily - 7 x weekly - 1 sets - 10 reps - Supine Diaphragmatic Breathing  - 1 x daily - 7 x weekly - 1 sets - 10 reps - Supine Transversus Abdominis Bracing - Hands on Stomach  - 1 x daily - 7 x weekly - 2 sets - 10 reps - Sidelying Thoracic Rotation with Open Book  - 1 x daily - 7 x weekly - 1 sets - 3 reps - 30s holds - Supine Hip Internal and External Rotation  - 1 x daily - 7 x weekly - 1 sets - 10 reps - Supine 90/90 Alternating Heel Touches with Posterior Pelvic Tilt  - 1 x daily - 7 x weekly - 2 sets - 10 reps - Supine Dead Bug with Leg Extension  - 1 x daily - 7 x weekly - 2 sets - 10 reps - Standing Hamstring Stretch on Chair  - 1 x daily - 7 x weekly - 1 sets - 3 reps - 30 sec hold - Standing Quad Stretch with Table and Chair Support  - 1 x daily - 7 x weekly - 1 sets - 3 reps - 30 hold ASSESSMENT:  CLINICAL IMPRESSION: Chioma had some increased back pain with re-starting her lifting workout at home.  However, this dissipated by this morning.  She was able to do all tasks today with no increased pain but did fatigue quite easily with hopping and squat activities.  She is well motivated and compliant.  She should continue to do well.  She would benefit from continued skilled PT for pelvic floor guidance post partum and to address low back pain.     OBJECTIVE IMPAIRMENTS: decreased  coordination, decreased endurance, decreased mobility, decreased strength, increased fascial restrictions, impaired flexibility, impaired sensation, improper body mechanics, postural dysfunction, and pain.   ACTIVITY LIMITATIONS: continence and exercise  PARTICIPATION LIMITATIONS: community activity  PERSONAL FACTORS: Fitness, Time since onset of injury/illness/exacerbation, and 1 comorbidity: x3 c-sections  are also affecting patient's functional outcome.   REHAB POTENTIAL: Good  CLINICAL DECISION MAKING: Evolving/moderate complexity  EVALUATION COMPLEXITY: Moderate   GOALS: Goals reviewed with patient? Yes  SHORT TERM GOALS: Target date: 12/08/22  Pt to be I with HEP.  Baseline: Goal status: INITIAL  2.  Pt to tolerate body weight squats with good technique and pressure management and denies bleeding.  Baseline:  Goal status: INITIAL   LONG TERM GOALS: Target date: 03/12/23  Pt to be I with advanced HEP.  Baseline:  Goal status: INITIAL  2.  Pt will report 50% reduction of pain due to improvements in posture, strength, and muscle length  Baseline: 10/10 at worst Goal status: INITIAL  3.  Pt to demonstrate at least 4/5 pelvic floor strength for improved pelvic stability and decreased strain at pelvic floor/ decrease leakage.  Baseline:  Goal status: INITIAL  4.  Pt to demonstrate improved coordination of pelvic floor and breathing mechanics with 30# squat with appropriate synergistic patterns to decrease pain and leakage at least 75% of the time.    Baseline:  Goal status: INITIAL  5.  Pt to demonstrate at least 5/5 bil hip strength for improved pelvic stability and functional squats without leakage.  Baseline:  Goal status: INITIAL  6. Pt to report no more than one urinary leakage instance in a month for improved QOL and decreased leakage.  Baseline:  Goal status: INITIAL  PLAN:  PT FREQUENCY: 2x/week  PT DURATION: 10 weeks  PLANNED INTERVENTIONS:  Therapeutic exercises, Therapeutic activity, Neuromuscular re-education, Patient/Family education, Self Care, Joint mobilization, DME instructions, Aquatic Therapy, Dry Needling, Spinal mobilization, Cryotherapy, Moist heat, scar mobilization, Taping, Vasopneumatic device, Biofeedback, Ionotophoresis 4mg /ml Dexamethasone, Manual therapy, and Re-evaluation  PLAN FOR NEXT SESSION: DN to lumbar spine. Continue to progress core and hip strengthening, coordination of pelvic floor and breathing, flexibility in back and hips,   Marquarius Lofton B. Viktorya Arguijo, PT 12/10/22 9:34 AM [facility] [address] [place], [place] [address] Phone # [phone] Fax [phone]

## 2022-12-14 ENCOUNTER — Ambulatory Visit: Payer: Medicaid Other | Admitting: Physical Therapy

## 2022-12-14 DIAGNOSIS — R279 Unspecified lack of coordination: Secondary | ICD-10-CM

## 2022-12-14 DIAGNOSIS — R262 Difficulty in walking, not elsewhere classified: Secondary | ICD-10-CM | POA: Diagnosis not present

## 2022-12-14 DIAGNOSIS — R252 Cramp and spasm: Secondary | ICD-10-CM

## 2022-12-14 DIAGNOSIS — R293 Abnormal posture: Secondary | ICD-10-CM

## 2022-12-14 DIAGNOSIS — M6281 Muscle weakness (generalized): Secondary | ICD-10-CM

## 2022-12-14 NOTE — Therapy (Signed)
OUTPATIENT PHYSICAL THERAPY FEMALE PELVIC EVALUATION   Patient Name: [name] [name] MRN: [id number] DOB:1991/01/04, 32 y.o., female Today's Date: 12/14/2022  END OF SESSION:  PT End of Session - 12/14/22 1407     Visit Number 10    Date for PT Re-Evaluation 03/12/23    Authorization Type [place] MEDICAID [employer]    PT Start Time 1403    PT Stop Time 1442    PT Time Calculation (min) 39 min    Activity Tolerance Patient tolerated treatment well    Behavior During Therapy [facility] for tasks assessed/performed               Past Medical History:  Diagnosis Date   Abnormal Pap smear 03/09/2010   Genital warts    Past Surgical History:  Procedure Laterality Date   CESAREAN SECTION  2009   CESAREAN SECTION  08/12/2011   Procedure: CESAREAN SECTION;  Surgeon: Catalina Antigua, MD;  Location: [facility];  Service: Gynecology;  Laterality: N/A;   CESAREAN SECTION N/A 03/20/2022   Procedure: REPEAT CESAREAN SECTION;  Surgeon: Osborn Coho, MD;  Location: [facility];  Service: Obstetrics;  Laterality: N/A;   DILATION AND CURETTAGE OF UTERUS  2007   TUBAL LIGATION Bilateral 03/20/2022   Procedure: BILATERAL TUBAL LIGATION;  Surgeon: Osborn Coho, MD;  Location: [facility];  Service: Obstetrics;  Laterality: Bilateral;  Ligasure used on both tubes   Patient Active Problem List   Diagnosis Date Noted   Preeclampsia in postpartum period 03/26/2022   PPH (postpartum hemorrhage) 03/23/2022   Status post repeat low transverse cesarean section 03/20/2022   History of miscarriage x 2 03/14/2022   Penicillin allergy 03/14/2022   History of 2 cesarean sections 03/13/2022   Positive GBS test 03/13/2022   LGA (large for gestational age) fetus affecting management of mother 03/13/2022   Family history of hypertrophic cardiomyopathy 12/17/2016   Family history of Wolff-Parkinson-White (WPW) syndrome 12/17/2016    PCP: none per chart  REFERRING PROVIDER: Hands, Kylie,  NP   REFERRING DIAG: M62.9 (ICD-10-CM) - Disorder of muscle, unspecified  THERAPY DIAG:  Cramp and spasm  Muscle weakness (generalized)  Abnormal posture  Unspecified lack of coordination  Rationale for Evaluation and Treatment: Rehabilitation  ONSET DATE: 7 months ago  SUBJECTIVE:                                                                                                                                                                                           SUBJECTIVE STATEMENT: Pt states she's been having back pain this past week, "my whole back has been hurting".  Fluid intake: Yes: water - a gallon day, a lot of caffeine right now (2 cups day) usually did not due caffeine .    PAIN:  Are you having pain? Yes NPRS scale: 7/10 Pain location:  whole back  a lot of tension in mid spine  Pain type: achy Pain description: constant   Aggravating factors: everything Alleviating: nothing   PRECAUTIONS: Other: postpartum  RED FLAGS: None   WEIGHT BEARING RESTRICTIONS: No  FALLS:  Has patient fallen in last 6 months? No  LIVING ENVIRONMENT: Lives with: lives with their family Lives in: House/apartment   OCCUPATION: not currently   PLOF: Independent  PATIENT GOALS: to be stronger especially at pelvic floor, I want to get back to consistent strength training.   PERTINENT HISTORY:  Preeclampsia in postpartum period, 3 c-sections Sexual abuse: No  BOWEL MOVEMENT: Pain with bowel movement: Yes Type of bowel movement:Type (Bristol Stool Scale) 4, Frequency daily, and Strain No Fully empty rectum: No Leakage: No Pads: No Fiber supplement: No  URINATION: Pain with urination: No Fully empty bladder: Yes:   Stream: Strong Urgency: Yes:   Frequency: 2x hourly; not really, gets up for baby  and may go to the bathroom.  Leakage: Urge to void Pads: No  INTERCOURSE: Pain with intercourse:  not painful, denies dryness, no bleeding  Ability to have  vaginal penetration:  Yes:   Climax: not painful Marinoff Scale: 0/3  PREGNANCY: Vaginal deliveries 0  C-section deliveries 3 Currently pregnant No  PROLAPSE: None   OBJECTIVE:   DIAGNOSTIC FINDINGS:  Ultrasound done last week - (-)  PATIENT SURVEYS:   PFIQ-7 29  COGNITION: Overall cognitive status: Within functional limits for tasks assessed     SENSATION: Light touch: Appears intact Proprioception: Appears intact  MUSCLE LENGTH: Bil hamstrings and adductors limited by 25%   POSTURE: rounded shoulders, forward head, and anterior pelvic tilt  PELVIC ALIGNMENT: WFL  LUMBARAROM/PROM:  A/PROM A/PROM  eval  Flexion WFL  Extension WFL  Right lateral flexion Limited by 25%  Left lateral flexion Limited by 25%  Right rotation Limited by 25%  Left rotation Limited by 25%   (Blank rows = not tested)  LOWER EXTREMITY ROM:  WFL  LOWER EXTREMITY MMT:  Bil hips grossly 4/5; knees 5/5  PALPATION:   General  no TTP, but did have some tension at lumbar spine and glutes C-section scar limited in all quadrants and in all directions.                 External Perineal Exam - no pain, [facility]                             Internal Pelvic Floor no pain  Patient confirms identification and approves PT to assess internal pelvic floor and treatment No  PELVIC MMT:   MMT eval 11/24/22   Vaginal  3/5; 3s; 4x  Internal Anal Sphincter    External Anal Sphincter    Puborectalis    Diastasis Recti <1 finger separation above and below with active crunch    (Blank rows = not tested)        TONE: WFL  PROLAPSE: Not seen in hooklying with cough   TODAY'S TREATMENT:  DATE:  12/14/22: Moist heat at mid and low back - during stretches, no adverse reaction post removal Happy baby 3x30s Thoracic openers 3x30s each Wind shield wipes Cat/cow x10   Seated TA activations with exhale 2x10 - added ball squeeze as pt demonstrated difficulty contracting TA this improved it Manual - c-section scar site with pt in trunk extension over bosu ball with diaphragmatic breathing throughout, with increased tolerance pt progressed to bil UE over head as well to improve stretch. Gentle manual scar tissue mobility completed over scar, medial Rt and Lt quadrants of scar most tight today and for about 2 inches above scarring pt demonstrated continued fascial restrictions. Fascial release completed here as well. Suction cup used intermittently for improved tissue mobility as well.   12/10/22: Nustep  x 5 min level 5 (PT present to discuss status) Squats with 5 lb kb x 10 (with instruction to breathe out on the downward motion to engage pelvic floor) Power squats with 5 lb kb x 10 (with instruction to breathe out on the downward motion to engage pelvic floor) Backward and fwd hops x 10 each way (with instruction to breathe out on the downward motion to engage pelvic floor) Hooklying PPT with TA focus and pelvic floor engagement x 20 90/90 heel taps x 20 Dying bug x 20  Hooklying clam with black loop with PPT and pelvic floor engagement x 20 Bridge with clam x 10 up and down, then x 10 staying in bridge position Side lying single leg clam x 20 with black loop Bridge on red physio ball with pelvic floor engagement at top x 20 Bridge on 16" side of ritfit box with pelvic floor engagement at top x 20 Hooklying hip IR/ER combo stretch x 10 each side Hooklying trunk rotation x 20 Star Crunch x 20 (verbal cues for straightening knees) PPT with ball pass x 10 using red physio ball  Quadruped bird dog x 20 Superman 2 x 10 Standing hamstring stretch 3 x 30 sec Standing quad/hip flexor stretch 3 x 30 sec  12/09/22: Manual - Scar mobility - gentle scar tissue mobility with fascial release work starting surrounding scar site at c-section scar and  direct release  technique within pt's tolerance, then onto scar site. Suction cup used after this at surrounding tissue with pt cued for diaphragmatic breathing all above scar site. Midline scar site and mid abdominal quadrant is most tight today though surrounding tissue continues to be tight, but improved. Pt tolerated well with cues for decreased chest breathing and improved mobility with diaphragmatic breathing. Suction cup also used just above scar line with x5 diaphragmatic breathing each quadrant for improved mobility.   Therapeutic exercise: Mini cobra 2x30s Marjo Bicker pose with 360 breathing 3x30s Side bending childs pose 3x30s    12/08/22: Bike (Nustep not available) x 5 min level 5 (PT present to discuss status) Hooklying PPT with TA focus and pelvic floor engagement x 20 90/90 heel taps x 20 Dying bug x 20  Hooklying clam with red band with PPT and pelvic floor engagement x 20 Bridge with clam x 10 up and down, then x 10 staying in bridge position Bridge on red physio ball with pelvic floor engagement at top x 20 Bridge on 16" side of ritfit box with pelvic floor engagement at top x 20  Hooklying hip IR/ER combo stretch x 10 each side Hooklying trunk rotation x 20 Star Crunch x 20 (verbal cues for straightening knees) PPT with ball pass x 10 using  red physio ball  Quadruped bird dog x 20 Superman 2 x 10 Standing hamstring stretch 3 x 30 sec Standing quad/hip flexor stretch 3 x 30 sec  12/01/22  Scar mobility - gentle scar tissue mobility with fascial release work starting surrounding scar site at c-section scar and progressing from indirect to direct release technique within pt's tolerance, then onto scar site.  Suction cup used after this at surrounding tissue with pt cued for diaphragmatic breathing all above scar site.   No emotional/communication barriers or cognitive limitation. Patient is motivated to learn. Patient understands and agrees with treatment goals and plan. PT explains patient  will be examined in standing, sitting, and lying down to see how their muscles and joints work. When they are ready, they will be asked to remove their underwear so PT can examine their perineum. The patient is also given the option of providing their own chaperone as one is not provided in our facility. The patient also has the right and is explained the right to defer or refuse any part of the evaluation or treatment including the internal exam. With the patient's consent, PT will use one gloved finger to gently assess the muscles of the pelvic floor, seeing how well it contracts and relaxes and if there is muscle symmetry. After, the patient will get dressed and PT and patient will discuss exam findings and plan of care. PT and patient discuss plan of care, schedule, attendance policy and HEP activities.                  PATIENT EDUCATION:  Education details: [phone] Person educated: Patient Education method: Explanation, Demonstration, Tactile cues, Verbal cues, and Handouts Education comprehension: verbalized understanding and returned demonstration  HOME EXERCISE PROGRAM: Access Code: [email or web address] URL: [email or web address] Date: 11/26/2022 Prepared by: Mikey Kirschner  Exercises - Wall Angels  - 1 x daily - 7 x weekly - 1 sets - 3 reps - 30s holds - Cat Cow  - 1 x daily - 7 x weekly - 1 sets - 10 reps - Supine Diaphragmatic Breathing  - 1 x daily - 7 x weekly - 1 sets - 10 reps - Supine Transversus Abdominis Bracing - Hands on Stomach  - 1 x daily - 7 x weekly - 2 sets - 10 reps - Sidelying Thoracic Rotation with Open Book  - 1 x daily - 7 x weekly - 1 sets - 3 reps - 30s holds - Supine Hip Internal and External Rotation  - 1 x daily - 7 x weekly - 1 sets - 10 reps - Supine 90/90 Alternating Heel Touches with Posterior Pelvic Tilt  - 1 x daily - 7 x weekly - 2 sets - 10 reps - Supine Dead Bug with Leg Extension  - 1 x daily - 7 x weekly - 2 sets - 10 reps - Standing  Hamstring Stretch on Chair  - 1 x daily - 7 x weekly - 1 sets - 3 reps - 30 sec hold - Standing Quad Stretch with Table and Chair Support  - 1 x daily - 7 x weekly - 1 sets - 3 reps - 30 hold ASSESSMENT:  CLINICAL IMPRESSION: Hartleigh does have continued back pain for the past few days, unsure direct cause per pt. Pt tolerated session well and demonstrated improved scar tissue mobility compared to first session. Pt tolerating more manual work with less discomfort and completing scar mobility at home as well. Pt did demonstrate difficulty contracting TA in sitting  and benefited from max cues to improve this. She would benefit from continued skilled PT for pelvic floor guidance post partum and to address low back pain.     OBJECTIVE IMPAIRMENTS: decreased coordination, decreased endurance, decreased mobility, decreased strength, increased fascial restrictions, impaired flexibility, impaired sensation, improper body mechanics, postural dysfunction, and pain.   ACTIVITY LIMITATIONS: continence and exercise  PARTICIPATION LIMITATIONS: community activity  PERSONAL FACTORS: Fitness, Time since onset of injury/illness/exacerbation, and 1 comorbidity: x3 c-sections  are also affecting patient's functional outcome.   REHAB POTENTIAL: Good  CLINICAL DECISION MAKING: Evolving/moderate complexity  EVALUATION COMPLEXITY: Moderate   GOALS: Goals reviewed with patient? Yes  SHORT TERM GOALS: Target date: 12/08/22  Pt to be I with HEP.  Baseline: Goal status: INITIAL  2.  Pt to tolerate body weight squats with good technique and pressure management and denies bleeding.  Baseline:  Goal status: INITIAL   LONG TERM GOALS: Target date: 03/12/23  Pt to be I with advanced HEP.  Baseline:  Goal status: INITIAL  2.  Pt will report 50% reduction of pain due to improvements in posture, strength, and muscle length  Baseline: 10/10 at worst Goal status: INITIAL  3.  Pt to demonstrate at least 4/5  pelvic floor strength for improved pelvic stability and decreased strain at pelvic floor/ decrease leakage.  Baseline:  Goal status: INITIAL  4.  Pt to demonstrate improved coordination of pelvic floor and breathing mechanics with 30# squat with appropriate synergistic patterns to decrease pain and leakage at least 75% of the time.    Baseline:  Goal status: INITIAL  5.  Pt to demonstrate at least 5/5 bil hip strength for improved pelvic stability and functional squats without leakage.  Baseline:  Goal status: INITIAL  6. Pt to report no more than one urinary leakage instance in a month for improved QOL and decreased leakage.  Baseline:  Goal status: INITIAL  PLAN:  PT FREQUENCY: 2x/week  PT DURATION: 10 weeks  PLANNED INTERVENTIONS: Therapeutic exercises, Therapeutic activity, Neuromuscular re-education, Patient/Family education, Self Care, Joint mobilization, DME instructions, Aquatic Therapy, Dry Needling, Spinal mobilization, Cryotherapy, Moist heat, scar mobilization, Taping, Vasopneumatic device, Biofeedback, Ionotophoresis 4mg /ml Dexamethasone, Manual therapy, and Re-evaluation  PLAN FOR NEXT SESSION: DN to lumbar spine. Continue to progress core and hip strengthening, coordination of pelvic floor and breathing, flexibility in back and hips,   Jennifer B. Fields, PT 12/14/22 3:31 PM [facility] [address] [place], [place] [address] Phone # [phone] Fax [phone]

## 2022-12-15 ENCOUNTER — Ambulatory Visit: Payer: Medicaid Other

## 2022-12-15 DIAGNOSIS — R252 Cramp and spasm: Secondary | ICD-10-CM

## 2022-12-15 DIAGNOSIS — M6281 Muscle weakness (generalized): Secondary | ICD-10-CM

## 2022-12-15 DIAGNOSIS — R293 Abnormal posture: Secondary | ICD-10-CM

## 2022-12-15 DIAGNOSIS — R262 Difficulty in walking, not elsewhere classified: Secondary | ICD-10-CM | POA: Diagnosis not present

## 2022-12-15 NOTE — Therapy (Signed)
OUTPATIENT PHYSICAL THERAPY FEMALE PELVIC TREATMENT   Patient Name: [name] [name] MRN: [id number] DOB:[birth year], 32 y.o., female Today's Date: 12/15/2022  END OF SESSION:  PT End of Session - 12/15/22 0903     Visit Number 11    Date for PT Re-Evaluation 03/12/23    Authorization Type [place] MEDICAID [employer]    PT Start Time 0845    PT Stop Time 0931    PT Time Calculation (min) 46 min    Activity Tolerance Patient tolerated treatment well    Behavior During Therapy [facility] for tasks assessed/performed               Past Medical History:  Diagnosis Date   Abnormal Pap smear 03/09/2010   Genital warts    Past Surgical History:  Procedure Laterality Date   CESAREAN SECTION  2009   CESAREAN SECTION  08/12/2011   Procedure: CESAREAN SECTION;  Surgeon: Catalina Antigua, MD;  Location: [facility];  Service: Gynecology;  Laterality: N/A;   CESAREAN SECTION N/A 03/20/2022   Procedure: REPEAT CESAREAN SECTION;  Surgeon: Osborn Coho, MD;  Location: [facility];  Service: Obstetrics;  Laterality: N/A;   DILATION AND CURETTAGE OF UTERUS  2007   TUBAL LIGATION Bilateral 03/20/2022   Procedure: BILATERAL TUBAL LIGATION;  Surgeon: Osborn Coho, MD;  Location: [facility];  Service: Obstetrics;  Laterality: Bilateral;  Ligasure used on both tubes   Patient Active Problem List   Diagnosis Date Noted   Preeclampsia in postpartum period 03/26/2022   PPH (postpartum hemorrhage) 03/23/2022   Status post repeat low transverse cesarean section 03/20/2022   History of miscarriage x 2 03/14/2022   Penicillin allergy 03/14/2022   History of 2 cesarean sections 03/13/2022   Positive GBS test 03/13/2022   LGA (large for gestational age) fetus affecting management of mother 03/13/2022   Family history of hypertrophic cardiomyopathy 12/17/2016   Family history of Wolff-Parkinson-White (WPW) syndrome 12/17/2016    PCP: none per chart  REFERRING PROVIDER: Hands, Kylie,  NP   REFERRING DIAG: M62.9 (ICD-10-CM) - Disorder of muscle, unspecified  THERAPY DIAG:  Cramp and spasm  Muscle weakness (generalized)  Abnormal posture  Difficulty in walking, not elsewhere classified  Rationale for Evaluation and Treatment: Rehabilitation  ONSET DATE: 7 months ago  SUBJECTIVE:                                                                                                                                                                                           SUBJECTIVE STATEMENT: Pt states her back continues to hurt.  "I think I'm just sore from some of the  stretches from last visit"   Fluid intake: Yes: water - a gallon day, a lot of caffeine right now (2 cups day) usually did not due caffeine .    PAIN:  Are you having pain? Yes NPRS scale: 7/10 Pain location:  whole back  a lot of tension in mid spine  Pain type: achy Pain description: constant   Aggravating factors: everything Alleviating: nothing   PRECAUTIONS: Other: postpartum  RED FLAGS: None   WEIGHT BEARING RESTRICTIONS: No  FALLS:  Has patient fallen in last 6 months? No  LIVING ENVIRONMENT: Lives with: lives with their family Lives in: House/apartment   OCCUPATION: not currently   PLOF: Independent  PATIENT GOALS: to be stronger especially at pelvic floor, I want to get back to consistent strength training.   PERTINENT HISTORY:  Preeclampsia in postpartum period, 3 c-sections Sexual abuse: No  BOWEL MOVEMENT: Pain with bowel movement: Yes Type of bowel movement:Type (Bristol Stool Scale) 4, Frequency daily, and Strain No Fully empty rectum: No Leakage: No Pads: No Fiber supplement: No  URINATION: Pain with urination: No Fully empty bladder: Yes:   Stream: Strong Urgency: Yes:   Frequency: 2x hourly; not really, gets up for baby  and may go to the bathroom.  Leakage: Urge to void Pads: No  INTERCOURSE: Pain with intercourse:  not painful, denies dryness, no  bleeding  Ability to have vaginal penetration:  Yes:   Climax: not painful Marinoff Scale: 0/3  PREGNANCY: Vaginal deliveries 0  C-section deliveries 3 Currently pregnant No  PROLAPSE: None   OBJECTIVE:   DIAGNOSTIC FINDINGS:  Ultrasound done last week - (-)  PATIENT SURVEYS:   PFIQ-7 29  COGNITION: Overall cognitive status: Within functional limits for tasks assessed     SENSATION: Light touch: Appears intact Proprioception: Appears intact  MUSCLE LENGTH: Bil hamstrings and adductors limited by 25%   POSTURE: rounded shoulders, forward head, and anterior pelvic tilt  PELVIC ALIGNMENT: WFL  LUMBARAROM/PROM:  A/PROM A/PROM  eval  Flexion WFL  Extension WFL  Right lateral flexion Limited by 25%  Left lateral flexion Limited by 25%  Right rotation Limited by 25%  Left rotation Limited by 25%   (Blank rows = not tested)  LOWER EXTREMITY ROM:  WFL  LOWER EXTREMITY MMT:  Bil hips grossly 4/5; knees 5/5  PALPATION:   General  no TTP, but did have some tension at lumbar spine and glutes C-section scar limited in all quadrants and in all directions.                 External Perineal Exam - no pain, [facility]                             Internal Pelvic Floor no pain  Patient confirms identification and approves PT to assess internal pelvic floor and treatment No  PELVIC MMT:   MMT eval 11/24/22   Vaginal  3/5; 3s; 4x  Internal Anal Sphincter    External Anal Sphincter    Puborectalis    Diastasis Recti <1 finger separation above and below with active crunch    (Blank rows = not tested)        TONE: WFL  PROLAPSE: Not seen in hooklying with cough   TODAY'S TREATMENT:  DATE:  12/15/22: Nustep  x 5 min level 5 (PT present to discuss status) Standing hamstring stretch 3 x 30 sec Standing quad stretch 3 x 30 sec Seated  piriformis stretch 3 x 30 sec Trigger Point Dry-Needling  Treatment instructions: Expect mild to moderate muscle soreness. S/S of pneumothorax if dry needled over a lung field, and to seek immediate medical attention should they occur. Patient verbalized understanding of these instructions and education. Patient Consent Given: Yes Education handout provided: Yes Muscles treated: Lumbar multifidi,  Electrical stimulation performed: No Parameters: N/A Treatment response/outcome: Skilled palpation used to identify taut bands and trigger points.  Once identified, dry needling techniques used to treat these areas.  Twitch response ellicited along with palpable elongation of muscle.  Following treatment,  patient reported mild soreness in upper traps and low back.    12/14/22: Moist heat at mid and low back - during stretches, no adverse reaction post removal Happy baby 3x30s Thoracic openers 3x30s each Wind shield wipes Cat/cow x10  Seated TA activations with exhale 2x10 - added ball squeeze as pt demonstrated difficulty contracting TA this improved it Manual - c-section scar site with pt in trunk extension over bosu ball with diaphragmatic breathing throughout, with increased tolerance pt progressed to bil UE over head as well to improve stretch. Gentle manual scar tissue mobility completed over scar, medial Rt and Lt quadrants of scar most tight today and for about 2 inches above scarring pt demonstrated continued fascial restrictions. Fascial release completed here as well. Suction cup used intermittently for improved tissue mobility as well.   12/10/22: Nustep  x 5 min level 5 (PT present to discuss status) Squats with 5 lb kb x 10 (with instruction to breathe out on the downward motion to engage pelvic floor) Power squats with 5 lb kb x 10 (with instruction to breathe out on the downward motion to engage pelvic floor) Backward and fwd hops x 10 each way (with instruction to breathe out on the  downward motion to engage pelvic floor) Hooklying PPT with TA focus and pelvic floor engagement x 20 90/90 heel taps x 20 Dying bug x 20  Hooklying clam with black loop with PPT and pelvic floor engagement x 20 Bridge with clam x 10 up and down, then x 10 staying in bridge position Side lying single leg clam x 20 with black loop Bridge on red physio ball with pelvic floor engagement at top x 20 Bridge on 16" side of ritfit box with pelvic floor engagement at top x 20 Hooklying hip IR/ER combo stretch x 10 each side Hooklying trunk rotation x 20 Star Crunch x 20 (verbal cues for straightening knees) PPT with ball pass x 10 using red physio ball  Quadruped bird dog x 20 Superman 2 x 10 Standing hamstring stretch 3 x 30 sec Standing quad/hip flexor stretch 3 x 30 sec   No emotional/communication barriers or cognitive limitation. Patient is motivated to learn. Patient understands and agrees with treatment goals and plan. PT explains patient will be examined in standing, sitting, and lying down to see how their muscles and joints work. When they are ready, they will be asked to remove their underwear so PT can examine their perineum. The patient is also given the option of providing their own chaperone as one is not provided in our facility. The patient also has the right and is explained the right to defer or refuse any part of the evaluation or treatment including the internal exam.  With the patient's consent, PT will use one gloved finger to gently assess the muscles of the pelvic floor, seeing how well it contracts and relaxes and if there is muscle symmetry. After, the patient will get dressed and PT and patient will discuss exam findings and plan of care. PT and patient discuss plan of care, schedule, attendance policy and HEP activities.                  PATIENT EDUCATION:  Education details: [phone] Person educated: Patient Education method: Explanation, Demonstration, Tactile cues,  Verbal cues, and Handouts Education comprehension: verbalized understanding and returned demonstration  HOME EXERCISE PROGRAM: Access Code: [email or web address] URL: [email or web address] Date: 11/26/2022 Prepared by: Mikey Kirschner  Exercises - Wall Angels  - 1 x daily - 7 x weekly - 1 sets - 3 reps - 30s holds - Cat Cow  - 1 x daily - 7 x weekly - 1 sets - 10 reps - Supine Diaphragmatic Breathing  - 1 x daily - 7 x weekly - 1 sets - 10 reps - Supine Transversus Abdominis Bracing - Hands on Stomach  - 1 x daily - 7 x weekly - 2 sets - 10 reps - Sidelying Thoracic Rotation with Open Book  - 1 x daily - 7 x weekly - 1 sets - 3 reps - 30s holds - Supine Hip Internal and External Rotation  - 1 x daily - 7 x weekly - 1 sets - 10 reps - Supine 90/90 Alternating Heel Touches with Posterior Pelvic Tilt  - 1 x daily - 7 x weekly - 2 sets - 10 reps - Supine Dead Bug with Leg Extension  - 1 x daily - 7 x weekly - 2 sets - 10 reps - Standing Hamstring Stretch on Chair  - 1 x daily - 7 x weekly - 1 sets - 3 reps - 30 sec hold - Standing Quad Stretch with Table and Chair Support  - 1 x daily - 7 x weekly - 1 sets - 3 reps - 30 hold ASSESSMENT:  CLINICAL IMPRESSION: Zanayah continues to experience low back pain despite high level core and stretching program which she completes with min fatigue and with good technique.  We attempted dry needling today to see if this will decreased the spasm and taut bands in the low back.  She would benefit from continued skilled PT for pelvic floor guidance post partum and to address low back pain.     OBJECTIVE IMPAIRMENTS: decreased coordination, decreased endurance, decreased mobility, decreased strength, increased fascial restrictions, impaired flexibility, impaired sensation, improper body mechanics, postural dysfunction, and pain.   ACTIVITY LIMITATIONS: continence and exercise  PARTICIPATION LIMITATIONS: community activity  PERSONAL FACTORS: Fitness, Time  since onset of injury/illness/exacerbation, and 1 comorbidity: x3 c-sections  are also affecting patient's functional outcome.   REHAB POTENTIAL: Good  CLINICAL DECISION MAKING: Evolving/moderate complexity  EVALUATION COMPLEXITY: Moderate   GOALS: Goals reviewed with patient? Yes  SHORT TERM GOALS: Target date: 12/08/22  Pt to be I with HEP.  Baseline: Goal status: INITIAL  2.  Pt to tolerate body weight squats with good technique and pressure management and denies bleeding.  Baseline:  Goal status: INITIAL   LONG TERM GOALS: Target date: 03/12/23  Pt to be I with advanced HEP.  Baseline:  Goal status: INITIAL  2.  Pt will report 50% reduction of pain due to improvements in posture, strength, and muscle length  Baseline: 10/10 at worst Goal status: INITIAL  3.  Pt to demonstrate at least 4/5 pelvic floor strength for improved pelvic stability and decreased strain at pelvic floor/ decrease leakage.  Baseline:  Goal status: INITIAL  4.  Pt to demonstrate improved coordination of pelvic floor and breathing mechanics with 30# squat with appropriate synergistic patterns to decrease pain and leakage at least 75% of the time.    Baseline:  Goal status: INITIAL  5.  Pt to demonstrate at least 5/5 bil hip strength for improved pelvic stability and functional squats without leakage.  Baseline:  Goal status: INITIAL  6. Pt to report no more than one urinary leakage instance in a month for improved QOL and decreased leakage.  Baseline:  Goal status: INITIAL  PLAN:  PT FREQUENCY: 2x/week  PT DURATION: 10 weeks  PLANNED INTERVENTIONS: Therapeutic exercises, Therapeutic activity, Neuromuscular re-education, Patient/Family education, Self Care, Joint mobilization, DME instructions, Aquatic Therapy, Dry Needling, Spinal mobilization, Cryotherapy, Moist heat, scar mobilization, Taping, Vasopneumatic device, Biofeedback, Ionotophoresis 4mg /ml Dexamethasone, Manual therapy, and  Re-evaluation  PLAN FOR NEXT SESSION: Assess response to DN #1 to lumbar spine and upper traps. Continue to progress core and hip strengthening, coordination of pelvic floor and breathing, flexibility in back and hips,   Mable Lashley B. Abagale Boulos, PT 12/15/22 10:27 AM [facility] [address], [address] [place], [place] [address] Phone # [phone] Fax [phone]

## 2022-12-17 ENCOUNTER — Ambulatory Visit: Payer: Medicaid Other

## 2022-12-17 DIAGNOSIS — R252 Cramp and spasm: Secondary | ICD-10-CM

## 2022-12-17 DIAGNOSIS — R262 Difficulty in walking, not elsewhere classified: Secondary | ICD-10-CM | POA: Diagnosis not present

## 2022-12-17 DIAGNOSIS — M5459 Other low back pain: Secondary | ICD-10-CM

## 2022-12-17 DIAGNOSIS — M6281 Muscle weakness (generalized): Secondary | ICD-10-CM

## 2022-12-17 NOTE — Therapy (Signed)
OUTPATIENT PHYSICAL THERAPY FEMALE PELVIC TREATMENT   Patient Name: [name] [name] MRN: [id number] DOB:1990/04/01, 32 y.o., female Today's Date: 12/17/2022  END OF SESSION:  PT End of Session - 12/17/22 0859     Visit Number 12    Date for PT Re-Evaluation 03/12/23    Authorization Type [place] MEDICAID [employer]    PT Start Time 0845    PT Stop Time 0926    PT Time Calculation (min) 41 min    Activity Tolerance Patient tolerated treatment well    Behavior During Therapy [facility] for tasks assessed/performed               Past Medical History:  Diagnosis Date   Abnormal Pap smear 03/09/2010   Genital warts    Past Surgical History:  Procedure Laterality Date   CESAREAN SECTION  2009   CESAREAN SECTION  08/12/2011   Procedure: CESAREAN SECTION;  Surgeon: Catalina Antigua, MD;  Location: [facility];  Service: Gynecology;  Laterality: N/A;   CESAREAN SECTION N/A 03/20/2022   Procedure: REPEAT CESAREAN SECTION;  Surgeon: Osborn Coho, MD;  Location: [facility];  Service: Obstetrics;  Laterality: N/A;   DILATION AND CURETTAGE OF UTERUS  2007   TUBAL LIGATION Bilateral 03/20/2022   Procedure: BILATERAL TUBAL LIGATION;  Surgeon: Osborn Coho, MD;  Location: [facility];  Service: Obstetrics;  Laterality: Bilateral;  Ligasure used on both tubes   Patient Active Problem List   Diagnosis Date Noted   Preeclampsia in postpartum period 03/26/2022   PPH (postpartum hemorrhage) 03/23/2022   Status post repeat low transverse cesarean section 03/20/2022   History of miscarriage x 2 03/14/2022   Penicillin allergy 03/14/2022   History of 2 cesarean sections 03/13/2022   Positive GBS test 03/13/2022   LGA (large for gestational age) fetus affecting management of mother 03/13/2022   Family history of hypertrophic cardiomyopathy 12/17/2016   Family history of Wolff-Parkinson-White (WPW) syndrome 12/17/2016    PCP: none per chart  REFERRING PROVIDER: Hands, Kylie,  NP   REFERRING DIAG: M62.9 (ICD-10-CM) - Disorder of muscle, unspecified  THERAPY DIAG:  Other low back pain  Cramp and spasm  Muscle weakness (generalized)  Difficulty in walking, not elsewhere classified  Rationale for Evaluation and Treatment: Rehabilitation  ONSET DATE: 7 months ago  SUBJECTIVE:                                                                                                                                                                                           SUBJECTIVE STATEMENT: Pt states her back continues to hurt.  No significant changes after the DN.  Fluid intake: Yes: water - a gallon day, a lot of caffeine right now (2 cups day) usually did not due caffeine .    PAIN:  Are you having pain? Yes NPRS scale: 7/10 Pain location:  whole back  a lot of tension in mid spine  Pain type: achy Pain description: constant   Aggravating factors: everything Alleviating: nothing   PRECAUTIONS: Other: postpartum  RED FLAGS: None   WEIGHT BEARING RESTRICTIONS: No  FALLS:  Has patient fallen in last 6 months? No  LIVING ENVIRONMENT: Lives with: lives with their family Lives in: House/apartment   OCCUPATION: not currently   PLOF: Independent  PATIENT GOALS: to be stronger especially at pelvic floor, I want to get back to consistent strength training.   PERTINENT HISTORY:  Preeclampsia in postpartum period, 3 c-sections Sexual abuse: No  BOWEL MOVEMENT: Pain with bowel movement: Yes Type of bowel movement:Type (Bristol Stool Scale) 4, Frequency daily, and Strain No Fully empty rectum: No Leakage: No Pads: No Fiber supplement: No  URINATION: Pain with urination: No Fully empty bladder: Yes:   Stream: Strong Urgency: Yes:   Frequency: 2x hourly; not really, gets up for baby  and may go to the bathroom.  Leakage: Urge to void Pads: No  INTERCOURSE: Pain with intercourse:  not painful, denies dryness, no bleeding  Ability to have  vaginal penetration:  Yes:   Climax: not painful Marinoff Scale: 0/3  PREGNANCY: Vaginal deliveries 0  C-section deliveries 3 Currently pregnant No  PROLAPSE: None   OBJECTIVE:   DIAGNOSTIC FINDINGS:  Ultrasound done last week - (-)  PATIENT SURVEYS:   PFIQ-7 29  COGNITION: Overall cognitive status: Within functional limits for tasks assessed     SENSATION: Light touch: Appears intact Proprioception: Appears intact  MUSCLE LENGTH: Bil hamstrings and adductors limited by 25%   POSTURE: rounded shoulders, forward head, and anterior pelvic tilt  PELVIC ALIGNMENT: WFL  LUMBARAROM/PROM:  A/PROM A/PROM  eval  Flexion WFL  Extension WFL  Right lateral flexion Limited by 25%  Left lateral flexion Limited by 25%  Right rotation Limited by 25%  Left rotation Limited by 25%   (Blank rows = not tested)  LOWER EXTREMITY ROM:  WFL  LOWER EXTREMITY MMT:  Bil hips grossly 4/5; knees 5/5  PALPATION:   General  no TTP, but did have some tension at lumbar spine and glutes C-section scar limited in all quadrants and in all directions.                 External Perineal Exam - no pain, [facility]                             Internal Pelvic Floor no pain  Patient confirms identification and approves PT to assess internal pelvic floor and treatment No  PELVIC MMT:   MMT eval 11/24/22   Vaginal  3/5; 3s; 4x  Internal Anal Sphincter    External Anal Sphincter    Puborectalis    Diastasis Recti <1 finger separation above and below with active crunch    (Blank rows = not tested)        TONE: WFL  PROLAPSE: Not seen in hooklying with cough   TODAY'S TREATMENT:  DATE:  12/17/22: Nustep  x 5 min level 5 (PT present to discuss status) Standing hamstring stretch 3 x 30 sec Standing quad stretch 3 x 30 sec Seated piriformis stretch 3 x 30  sec Trigger Point Dry-Needling  Treatment instructions: Expect mild to moderate muscle soreness. S/S of pneumothorax if dry needled over a lung field, and to seek immediate medical attention should they occur. Patient verbalized understanding of these instructions and education. Patient Consent Given: Yes Education handout provided: Yes Muscles treated: Lumbar multifidi,  Electrical stimulation performed: No Parameters: N/A Treatment response/outcome: Skilled palpation used to identify taut bands and trigger points.  Once identified, dry needling techniques used to treat these areas.  Twitch response ellicited along with palpable elongation of muscle.  Following treatment,  patient reported mild soreness in upper traps and low back.    12/15/22: Nustep  x 5 min level 5 (PT present to discuss status) Standing hamstring stretch 3 x 30 sec Standing quad stretch 3 x 30 sec Seated piriformis stretch 3 x 30 sec Trigger Point Dry-Needling  Treatment instructions: Expect mild to moderate muscle soreness. S/S of pneumothorax if dry needled over a lung field, and to seek immediate medical attention should they occur. Patient verbalized understanding of these instructions and education. Patient Consent Given: Yes Education handout provided: Yes Muscles treated: Lumbar multifidi,  Electrical stimulation performed: No Parameters: N/A Treatment response/outcome: Skilled palpation used to identify taut bands and trigger points.  Once identified, dry needling techniques used to treat these areas.  Twitch response ellicited along with palpable elongation of muscle.  Following treatment,  patient reported mild soreness in upper traps and low back.    12/14/22: Moist heat at mid and low back - during stretches, no adverse reaction post removal Happy baby 3x30s Thoracic openers 3x30s each Wind shield wipes Cat/cow x10  Seated TA activations with exhale 2x10 - added ball squeeze as pt demonstrated difficulty  contracting TA this improved it Manual - c-section scar site with pt in trunk extension over bosu ball with diaphragmatic breathing throughout, with increased tolerance pt progressed to bil UE over head as well to improve stretch. Gentle manual scar tissue mobility completed over scar, medial Rt and Lt quadrants of scar most tight today and for about 2 inches above scarring pt demonstrated continued fascial restrictions. Fascial release completed here as well. Suction cup used intermittently for improved tissue mobility as well.     No emotional/communication barriers or cognitive limitation. Patient is motivated to learn. Patient understands and agrees with treatment goals and plan. PT explains patient will be examined in standing, sitting, and lying down to see how their muscles and joints work. When they are ready, they will be asked to remove their underwear so PT can examine their perineum. The patient is also given the option of providing their own chaperone as one is not provided in our facility. The patient also has the right and is explained the right to defer or refuse any part of the evaluation or treatment including the internal exam. With the patient's consent, PT will use one gloved finger to gently assess the muscles of the pelvic floor, seeing how well it contracts and relaxes and if there is muscle symmetry. After, the patient will get dressed and PT and patient will discuss exam findings and plan of care. PT and patient discuss plan of care, schedule, attendance policy and HEP activities.  PATIENT EDUCATION:  Education details: [phone] Person educated: Patient Education method: Explanation, Demonstration, Tactile cues, Verbal cues, and Handouts Education comprehension: verbalized understanding and returned demonstration  HOME EXERCISE PROGRAM: Access Code: [email or web address] URL: [email or web address] Date: 11/26/2022 Prepared by: Mikey Kirschner  Exercises - Wall Angels  - 1 x daily - 7 x weekly - 1 sets - 3 reps - 30s holds - Cat Cow  - 1 x daily - 7 x weekly - 1 sets - 10 reps - Supine Diaphragmatic Breathing  - 1 x daily - 7 x weekly - 1 sets - 10 reps - Supine Transversus Abdominis Bracing - Hands on Stomach  - 1 x daily - 7 x weekly - 2 sets - 10 reps - Sidelying Thoracic Rotation with Open Book  - 1 x daily - 7 x weekly - 1 sets - 3 reps - 30s holds - Supine Hip Internal and External Rotation  - 1 x daily - 7 x weekly - 1 sets - 10 reps - Supine 90/90 Alternating Heel Touches with Posterior Pelvic Tilt  - 1 x daily - 7 x weekly - 2 sets - 10 reps - Supine Dead Bug with Leg Extension  - 1 x daily - 7 x weekly - 2 sets - 10 reps - Standing Hamstring Stretch on Chair  - 1 x daily - 7 x weekly - 1 sets - 3 reps - 30 sec hold - Standing Quad Stretch with Table and Chair Support  - 1 x daily - 7 x weekly - 1 sets - 3 reps - 30 hold ASSESSMENT:  CLINICAL IMPRESSION: Jessie continues to experience low back pain.  We tried DN to the piriformis and gluteals bilaterally to see if this would release any issues coming from the hips.  She is very hesitant to take any oral medications or have ESI.  We discussed that this would likely interrupt the pain cycle and encouraged her to discuss options with provider.    She would benefit from continued skilled PT for pelvic floor guidance post partum and to address low back pain.     OBJECTIVE IMPAIRMENTS: decreased coordination, decreased endurance, decreased mobility, decreased strength, increased fascial restrictions, impaired flexibility, impaired sensation, improper body mechanics, postural dysfunction, and pain.   ACTIVITY LIMITATIONS: continence and exercise  PARTICIPATION LIMITATIONS: community activity  PERSONAL FACTORS: Fitness, Time since onset of injury/illness/exacerbation, and 1 comorbidity: x3 c-sections  are also affecting patient's functional outcome.   REHAB POTENTIAL:  Good  CLINICAL DECISION MAKING: Evolving/moderate complexity  EVALUATION COMPLEXITY: Moderate   GOALS: Goals reviewed with patient? Yes  SHORT TERM GOALS: Target date: 12/08/22  Pt to be I with HEP.  Baseline: Goal status: INITIAL  2.  Pt to tolerate body weight squats with good technique and pressure management and denies bleeding.  Baseline:  Goal status: INITIAL   LONG TERM GOALS: Target date: 03/12/23  Pt to be I with advanced HEP.  Baseline:  Goal status: INITIAL  2.  Pt will report 50% reduction of pain due to improvements in posture, strength, and muscle length  Baseline: 10/10 at worst Goal status: INITIAL  3.  Pt to demonstrate at least 4/5 pelvic floor strength for improved pelvic stability and decreased strain at pelvic floor/ decrease leakage.  Baseline:  Goal status: INITIAL  4.  Pt to demonstrate improved coordination of pelvic floor and breathing mechanics with 30# squat with appropriate synergistic patterns to decrease pain and leakage at least 75% of the time.  Baseline:  Goal status: INITIAL  5.  Pt to demonstrate at least 5/5 bil hip strength for improved pelvic stability and functional squats without leakage.  Baseline:  Goal status: INITIAL  6. Pt to report no more than one urinary leakage instance in a month for improved QOL and decreased leakage.  Baseline:  Goal status: INITIAL  PLAN:  PT FREQUENCY: 2x/week  PT DURATION: 10 weeks  PLANNED INTERVENTIONS: Therapeutic exercises, Therapeutic activity, Neuromuscular re-education, Patient/Family education, Self Care, Joint mobilization, DME instructions, Aquatic Therapy, Dry Needling, Spinal mobilization, Cryotherapy, Moist heat, scar mobilization, Taping, Vasopneumatic device, Biofeedback, Ionotophoresis 4mg /ml Dexamethasone, Manual therapy, and Re-evaluation  PLAN FOR NEXT SESSION: Assess response to DN #2 to lumbar spine and upper traps. Continue to progress core and hip strengthening,  coordination of pelvic floor and breathing, flexibility in back and hips,   Jaun Galluzzo B. Yovanna Cogan, PT 12/17/22 10:17 AM [facility] [address] [place], [place] [address] Phone # [phone] Fax [phone]

## 2022-12-23 ENCOUNTER — Ambulatory Visit: Payer: Medicaid Other | Admitting: Physical Therapy

## 2022-12-23 ENCOUNTER — Encounter: Payer: Self-pay | Admitting: Physical Therapy

## 2022-12-23 DIAGNOSIS — R293 Abnormal posture: Secondary | ICD-10-CM

## 2022-12-23 DIAGNOSIS — R279 Unspecified lack of coordination: Secondary | ICD-10-CM

## 2022-12-23 DIAGNOSIS — M6281 Muscle weakness (generalized): Secondary | ICD-10-CM

## 2022-12-23 DIAGNOSIS — R262 Difficulty in walking, not elsewhere classified: Secondary | ICD-10-CM | POA: Diagnosis not present

## 2022-12-23 NOTE — Therapy (Signed)
OUTPATIENT PHYSICAL THERAPY FEMALE PELVIC TREATMENT   Patient Name: [name] [name] MRN: [id number] DOB:Dec 02, 1990, 32 y.o., female Today's Date: 12/23/2022  END OF SESSION:  PT End of Session - 12/23/22 1403     Visit Number 13    Date for PT Re-Evaluation 03/12/23    Authorization Type [place] MEDICAID [employer]    PT Start Time 1402    PT Stop Time 1443    PT Time Calculation (min) 41 min    Activity Tolerance Patient tolerated treatment well    Behavior During Therapy [facility] for tasks assessed/performed               Past Medical History:  Diagnosis Date   Abnormal Pap smear 03/09/2010   Genital warts    Past Surgical History:  Procedure Laterality Date   CESAREAN SECTION  2009   CESAREAN SECTION  08/12/2011   Procedure: CESAREAN SECTION;  Surgeon: Catalina Antigua, MD;  Location: [facility];  Service: Gynecology;  Laterality: N/A;   CESAREAN SECTION N/A 03/20/2022   Procedure: REPEAT CESAREAN SECTION;  Surgeon: Osborn Coho, MD;  Location: [facility];  Service: Obstetrics;  Laterality: N/A;   DILATION AND CURETTAGE OF UTERUS  2007   TUBAL LIGATION Bilateral 03/20/2022   Procedure: BILATERAL TUBAL LIGATION;  Surgeon: Osborn Coho, MD;  Location: [facility];  Service: Obstetrics;  Laterality: Bilateral;  Ligasure used on both tubes   Patient Active Problem List   Diagnosis Date Noted   Preeclampsia in postpartum period 03/26/2022   PPH (postpartum hemorrhage) 03/23/2022   Status post repeat low transverse cesarean section 03/20/2022   History of miscarriage x 2 03/14/2022   Penicillin allergy 03/14/2022   History of 2 cesarean sections 03/13/2022   Positive GBS test 03/13/2022   LGA (large for gestational age) fetus affecting management of mother 03/13/2022   Family history of hypertrophic cardiomyopathy 12/17/2016   Family history of Wolff-Parkinson-White (WPW) syndrome 12/17/2016    PCP: none per chart  REFERRING PROVIDER: Hands, Kylie,  NP   REFERRING DIAG: M62.9 (ICD-10-CM) - Disorder of muscle, unspecified  THERAPY DIAG:  Muscle weakness (generalized)  Abnormal posture  Unspecified lack of coordination  Rationale for Evaluation and Treatment: Rehabilitation  ONSET DATE: 7 months ago  SUBJECTIVE:                                                                                                                                                                                           SUBJECTIVE STATEMENT: Reports she ran on treadmill for 15 mins no spotting or symptoms and really pleased with this, has been doing workouts within tolerance  and no symptoms. Back pain still present.   Fluid intake: Yes: water - a gallon day, a lot of caffeine right now (2 cups day) usually did not due caffeine .    PAIN:  Are you having pain? Yes NPRS scale: 5/10 Pain location:  whole back  a lot of tension in mid spine  Pain type: achy Pain description: constant   Aggravating factors: everything Alleviating: nothing   PRECAUTIONS: Other: postpartum  RED FLAGS: None   WEIGHT BEARING RESTRICTIONS: No  FALLS:  Has patient fallen in last 6 months? No  LIVING ENVIRONMENT: Lives with: lives with their family Lives in: House/apartment   OCCUPATION: not currently   PLOF: Independent  PATIENT GOALS: to be stronger especially at pelvic floor, I want to get back to consistent strength training.   PERTINENT HISTORY:  Preeclampsia in postpartum period, 3 c-sections Sexual abuse: No  BOWEL MOVEMENT: Pain with bowel movement: Yes Type of bowel movement:Type (Bristol Stool Scale) 4, Frequency daily, and Strain No Fully empty rectum: No Leakage: No Pads: No Fiber supplement: No  URINATION: Pain with urination: No Fully empty bladder: Yes:   Stream: Strong Urgency: Yes:   Frequency: 2x hourly; not really, gets up for baby  and may go to the bathroom.  Leakage: Urge to void Pads: No  INTERCOURSE: Pain with  intercourse:  not painful, denies dryness, no bleeding  Ability to have vaginal penetration:  Yes:   Climax: not painful Marinoff Scale: 0/3  PREGNANCY: Vaginal deliveries 0  C-section deliveries 3 Currently pregnant No  PROLAPSE: None   OBJECTIVE:   DIAGNOSTIC FINDINGS:  Ultrasound done last week - (-)  PATIENT SURVEYS:   PFIQ-7 29  COGNITION: Overall cognitive status: Within functional limits for tasks assessed     SENSATION: Light touch: Appears intact Proprioception: Appears intact  MUSCLE LENGTH: Bil hamstrings and adductors limited by 25%   POSTURE: rounded shoulders, forward head, and anterior pelvic tilt  PELVIC ALIGNMENT: WFL  LUMBARAROM/PROM:  A/PROM A/PROM  eval  Flexion WFL  Extension WFL  Right lateral flexion Limited by 25%  Left lateral flexion Limited by 25%  Right rotation Limited by 25%  Left rotation Limited by 25%   (Blank rows = not tested)  LOWER EXTREMITY ROM:  WFL  LOWER EXTREMITY MMT:  Bil hips grossly 4/5; knees 5/5  PALPATION:   General  no TTP, but did have some tension at lumbar spine and glutes C-section scar limited in all quadrants and in all directions.                 External Perineal Exam - no pain, [facility]                             Internal Pelvic Floor no pain  Patient confirms identification and approves PT to assess internal pelvic floor and treatment No  PELVIC MMT:   MMT eval 11/24/22  12/23/22   Vaginal  3/5; 3s; 4x 4/5, 4s (drops to 3/5 and holds for remaining 6s), 7 reps  Internal Anal Sphincter     External Anal Sphincter     Puborectalis     Diastasis Recti <1 finger separation above and below with active crunch     (Blank rows = not tested)        TONE: WFL  PROLAPSE: Not seen in hooklying with cough   TODAY'S TREATMENT:  DATE:  12/23/22: Patient consented to  internal pelvic floor treatment vaginally this date and found to have improved strength, endurance, and coordination of pelvic floor. Pt denied pain throughout. Findings above. Directed in 2x10 contractions, x10 8-10s isometrics, x10 quick flicks.  Manual -c-section scar tissue mobility completed over scar, medial Rt and Lt quadrants of scar most tight today and for about 2 inches above scarring pt demonstrated continued fascial restrictions. Fascial release completed here as well. Suction cup used intermittently for improved tissue mobility as well. Also increased fascial release technique (direct) at midline of scar and up to 2 inc above scar site following tension. Pt tolerated well but does still report tenderness in these areas, manual work completed within pt's tolerance.  12/17/22: Nustep  x 5 min level 5 (PT present to discuss status) Standing hamstring stretch 3 x 30 sec Standing quad stretch 3 x 30 sec Seated piriformis stretch 3 x 30 sec Trigger Point Dry-Needling  Treatment instructions: Expect mild to moderate muscle soreness. S/S of pneumothorax if dry needled over a lung field, and to seek immediate medical attention should they occur. Patient verbalized understanding of these instructions and education. Patient Consent Given: Yes Education handout provided: Yes Muscles treated: Lumbar multifidi,  Electrical stimulation performed: No Parameters: N/A Treatment response/outcome: Skilled palpation used to identify taut bands and trigger points.  Once identified, dry needling techniques used to treat these areas.  Twitch response ellicited along with palpable elongation of muscle.  Following treatment,  patient reported mild soreness in upper traps and low back.    12/15/22: Nustep  x 5 min level 5 (PT present to discuss status) Standing hamstring stretch 3 x 30 sec Standing quad stretch 3 x 30 sec Seated piriformis stretch 3 x 30 sec Trigger Point Dry-Needling  Treatment  instructions: Expect mild to moderate muscle soreness. S/S of pneumothorax if dry needled over a lung field, and to seek immediate medical attention should they occur. Patient verbalized understanding of these instructions and education. Patient Consent Given: Yes Education handout provided: Yes Muscles treated: Lumbar multifidi,  Electrical stimulation performed: No Parameters: N/A Treatment response/outcome: Skilled palpation used to identify taut bands and trigger points.  Once identified, dry needling techniques used to treat these areas.  Twitch response ellicited along with palpable elongation of muscle.  Following treatment,  patient reported mild soreness in upper traps and low back.      No emotional/communication barriers or cognitive limitation. Patient is motivated to learn. Patient understands and agrees with treatment goals and plan. PT explains patient will be examined in standing, sitting, and lying down to see how their muscles and joints work. When they are ready, they will be asked to remove their underwear so PT can examine their perineum. The patient is also given the option of providing their own chaperone as one is not provided in our facility. The patient also has the right and is explained the right to defer or refuse any part of the evaluation or treatment including the internal exam. With the patient's consent, PT will use one gloved finger to gently assess the muscles of the pelvic floor, seeing how well it contracts and relaxes and if there is muscle symmetry. After, the patient will get dressed and PT and patient will discuss exam findings and plan of care. PT and patient discuss plan of care, schedule, attendance policy and HEP activities.                  PATIENT EDUCATION:  Education details: [id number] Person educated: Patient Education method: Explanation, Demonstration, Tactile cues, Verbal cues, and Handouts Education comprehension: verbalized understanding and  returned demonstration  HOME EXERCISE PROGRAM: Access Code: [email or web address] URL: [email or web address] Date: 11/26/2022 Prepared by: Mikey Kirschner  Exercises - Wall Angels  - 1 x daily - 7 x weekly - 1 sets - 3 reps - 30s holds - Cat Cow  - 1 x daily - 7 x weekly - 1 sets - 10 reps - Supine Diaphragmatic Breathing  - 1 x daily - 7 x weekly - 1 sets - 10 reps - Supine Transversus Abdominis Bracing - Hands on Stomach  - 1 x daily - 7 x weekly - 2 sets - 10 reps - Sidelying Thoracic Rotation with Open Book  - 1 x daily - 7 x weekly - 1 sets - 3 reps - 30s holds - Supine Hip Internal and External Rotation  - 1 x daily - 7 x weekly - 1 sets - 10 reps - Supine 90/90 Alternating Heel Touches with Posterior Pelvic Tilt  - 1 x daily - 7 x weekly - 2 sets - 10 reps - Supine Dead Bug with Leg Extension  - 1 x daily - 7 x weekly - 2 sets - 10 reps - Standing Hamstring Stretch on Chair  - 1 x daily - 7 x weekly - 1 sets - 3 reps - 30 sec hold - Standing Quad Stretch with Table and Chair Support  - 1 x daily - 7 x weekly - 1 sets - 3 reps - 30 hold ASSESSMENT:  CLINICAL IMPRESSION: pt present for treatment, does report continued back pain but pelvic symptoms are improving. Scar tissue mobility work completed today with pt demonstrating improvement however does still have significant restrictions throughout scar site and tenderness with palpation and manual release work intermittently. Pt tolerated session well today. Also completed internal vaginal pelvic floor with pt demonstrating improved strength/endurance/coordination. Pt reports no longer having spotting or pelvic symptoms with exercise. She would benefit from continued skilled PT for pelvic floor guidance post partum and to address low back pain.     OBJECTIVE IMPAIRMENTS: decreased coordination, decreased endurance, decreased mobility, decreased strength, increased fascial restrictions, impaired flexibility, impaired sensation, improper  body mechanics, postural dysfunction, and pain.   ACTIVITY LIMITATIONS: continence and exercise  PARTICIPATION LIMITATIONS: community activity  PERSONAL FACTORS: Fitness, Time since onset of injury/illness/exacerbation, and 1 comorbidity: x3 c-sections  are also affecting patient's functional outcome.   REHAB POTENTIAL: Good  CLINICAL DECISION MAKING: Evolving/moderate complexity  EVALUATION COMPLEXITY: Moderate   GOALS: Goals reviewed with patient? Yes  SHORT TERM GOALS: Target date: 12/08/22  Pt to be I with HEP.  Baseline: Goal status: MET  2.  Pt to tolerate body weight squats with good technique and pressure management and denies bleeding.  Baseline:  Goal status: MET   LONG TERM GOALS: Target date: 03/12/23  Pt to be I with advanced HEP.  Baseline:  Goal status: on going  2.  Pt will report 50% reduction of pain due to improvements in posture, strength, and muscle length  Baseline: 10/10 at worst Goal status: on going  3.  Pt to demonstrate at least 4/5 pelvic floor strength for improved pelvic stability and decreased strain at pelvic floor/ decrease leakage.  Baseline:  Goal status: MET  4.  Pt to demonstrate improved coordination of pelvic floor and breathing mechanics with 30# squat with appropriate synergistic patterns to decrease pain and leakage at least 75% of  the time.    Baseline:  Goal status: on going  5.  Pt to demonstrate at least 5/5 bil hip strength for improved pelvic stability and functional squats without leakage.  Baseline:  Goal status: on going  6. Pt to report no more than one urinary leakage instance in a month for improved QOL and decreased leakage.  Baseline:  Goal status: MET  PLAN:  PT FREQUENCY: 2x/week  PT DURATION: 10 weeks  PLANNED INTERVENTIONS: Therapeutic exercises, Therapeutic activity, Neuromuscular re-education, Patient/Family education, Self Care, Joint mobilization, DME instructions, Aquatic Therapy, Dry Needling,  Spinal mobilization, Cryotherapy, Moist heat, scar mobilization, Taping, Vasopneumatic device, Biofeedback, Ionotophoresis 4mg /ml Dexamethasone, Manual therapy, and Re-evaluation  PLAN FOR NEXT SESSION: Assess response to DN #2 to lumbar spine and upper traps. Continue to progress core and hip strengthening, coordination of pelvic floor and breathing, flexibility in back and hips,   Jennifer B. Fields, PT 12/23/22 2:52 PM [facility] [address], [address] [place], [place] [address] Phone # [phone] Fax [phone]

## 2022-12-25 ENCOUNTER — Ambulatory Visit: Payer: Medicaid Other

## 2022-12-25 DIAGNOSIS — M6281 Muscle weakness (generalized): Secondary | ICD-10-CM

## 2022-12-25 DIAGNOSIS — M5459 Other low back pain: Secondary | ICD-10-CM

## 2022-12-25 DIAGNOSIS — R252 Cramp and spasm: Secondary | ICD-10-CM

## 2022-12-25 DIAGNOSIS — R262 Difficulty in walking, not elsewhere classified: Secondary | ICD-10-CM | POA: Diagnosis not present

## 2022-12-25 DIAGNOSIS — R293 Abnormal posture: Secondary | ICD-10-CM

## 2022-12-25 NOTE — Therapy (Signed)
OUTPATIENT PHYSICAL THERAPY ORTHO TREATMENT   Patient Name: [name] [name] MRN: [id number] DOB:July 01, 1990, 32 y.o., female Today's Date: 12/25/2022  END OF SESSION:  PT End of Session - 12/25/22 0807     Visit Number 14    Date for PT Re-Evaluation 03/12/23    Authorization Type [place] MEDICAID [employer]    PT Start Time 0803    Activity Tolerance Patient tolerated treatment well    Behavior During Therapy [facility] for tasks assessed/performed               Past Medical History:  Diagnosis Date   Abnormal Pap smear 03/09/2010   Genital warts    Past Surgical History:  Procedure Laterality Date   CESAREAN SECTION  2009   CESAREAN SECTION  08/12/2011   Procedure: CESAREAN SECTION;  Surgeon: Catalina Antigua, MD;  Location: [facility];  Service: Gynecology;  Laterality: N/A;   CESAREAN SECTION N/A 03/20/2022   Procedure: REPEAT CESAREAN SECTION;  Surgeon: Osborn Coho, MD;  Location: [facility];  Service: Obstetrics;  Laterality: N/A;   DILATION AND CURETTAGE OF UTERUS  2007   TUBAL LIGATION Bilateral 03/20/2022   Procedure: BILATERAL TUBAL LIGATION;  Surgeon: Osborn Coho, MD;  Location: [facility];  Service: Obstetrics;  Laterality: Bilateral;  Ligasure used on both tubes   Patient Active Problem List   Diagnosis Date Noted   Preeclampsia in postpartum period 03/26/2022   PPH (postpartum hemorrhage) 03/23/2022   Status post repeat low transverse cesarean section 03/20/2022   History of miscarriage x 2 03/14/2022   Penicillin allergy 03/14/2022   History of 2 cesarean sections 03/13/2022   Positive GBS test 03/13/2022   LGA (large for gestational age) fetus affecting management of mother 03/13/2022   Family history of hypertrophic cardiomyopathy 12/17/2016   Family history of Wolff-Parkinson-White (WPW) syndrome 12/17/2016    PCP: none per chart  REFERRING PROVIDER: Hands, Kylie, NP   REFERRING DIAG: M62.9 (ICD-10-CM) - Disorder of muscle,  unspecified  THERAPY DIAG:  Muscle weakness (generalized)  Abnormal posture  Cramp and spasm  Other low back pain  Difficulty in walking, not elsewhere classified  Rationale for Evaluation and Treatment: Rehabilitation  ONSET DATE: 7 months ago  SUBJECTIVE:                                                                                                                                                                                           SUBJECTIVE STATEMENT: Pt states she had relief after the dry needling for about 2 days and started doing some running on the treadmill and jump roping because she felt so good.  She had some return of symptoms on the right but felt the dry needling really helped.    Fluid intake: Yes: water - a gallon day, a lot of caffeine right now (2 cups day) usually did not due caffeine .    PAIN:  Are you having pain? Yes NPRS scale: 7/10 Pain location:  whole back  a lot of tension in mid spine  Pain type: achy Pain description: constant   Aggravating factors: everything Alleviating: nothing   PRECAUTIONS: Other: postpartum  RED FLAGS: None   WEIGHT BEARING RESTRICTIONS: No  FALLS:  Has patient fallen in last 6 months? No  LIVING ENVIRONMENT: Lives with: lives with their family Lives in: House/apartment   OCCUPATION: not currently   PLOF: Independent  PATIENT GOALS: to be stronger especially at pelvic floor, I want to get back to consistent strength training.   PERTINENT HISTORY:  Preeclampsia in postpartum period, 3 c-sections Sexual abuse: No  BOWEL MOVEMENT: Pain with bowel movement: Yes Type of bowel movement:Type (Bristol Stool Scale) 4, Frequency daily, and Strain No Fully empty rectum: No Leakage: No Pads: No Fiber supplement: No  URINATION: Pain with urination: No Fully empty bladder: Yes:   Stream: Strong Urgency: Yes:   Frequency: 2x hourly; not really, gets up for baby  and may go to the bathroom.  Leakage:  Urge to void Pads: No  INTERCOURSE: Pain with intercourse:  not painful, denies dryness, no bleeding  Ability to have vaginal penetration:  Yes:   Climax: not painful Marinoff Scale: 0/3  PREGNANCY: Vaginal deliveries 0  C-section deliveries 3 Currently pregnant No  PROLAPSE: None   OBJECTIVE:   DIAGNOSTIC FINDINGS:  Ultrasound done last week - (-)  PATIENT SURVEYS:   PFIQ-7 29  COGNITION: Overall cognitive status: Within functional limits for tasks assessed     SENSATION: Light touch: Appears intact Proprioception: Appears intact  MUSCLE LENGTH: Bil hamstrings and adductors limited by 25%   POSTURE: rounded shoulders, forward head, and anterior pelvic tilt  PELVIC ALIGNMENT: WFL  LUMBARAROM/PROM:  A/PROM A/PROM  eval  Flexion WFL  Extension WFL  Right lateral flexion Limited by 25%  Left lateral flexion Limited by 25%  Right rotation Limited by 25%  Left rotation Limited by 25%   (Blank rows = not tested)  LOWER EXTREMITY ROM:  WFL  LOWER EXTREMITY MMT:  Bil hips grossly 4/5; knees 5/5  PALPATION:   General  no TTP, but did have some tension at lumbar spine and glutes C-section scar limited in all quadrants and in all directions.                 External Perineal Exam - no pain, [facility]                             Internal Pelvic Floor no pain  Patient confirms identification and approves PT to assess internal pelvic floor and treatment No  PELVIC MMT:   MMT eval 11/24/22   Vaginal  3/5; 3s; 4x  Internal Anal Sphincter    External Anal Sphincter    Puborectalis    Diastasis Recti <1 finger separation above and below with active crunch    (Blank rows = not tested)        TONE: WFL  PROLAPSE: Not seen in hooklying with cough   TODAY'S TREATMENT:  DATE:  12/25/22: Nustep  x 5 min level 5 (PT present to  discuss status) Standing hamstring stretch 3 x 30 sec Standing quad stretch 3 x 30 sec Seated piriformis stretch 3 x 30 sec Trigger Point Dry-Needling  Treatment instructions: Expect mild to moderate muscle soreness. S/S of pneumothorax if dry needled over a lung field, and to seek immediate medical attention should they occur. Patient verbalized understanding of these instructions and education. Patient Consent Given: Yes Education handout provided: Yes Muscles treated: Lumbar multifidi bilaterally, glut max, min and medius on right  Electrical stimulation performed: No Parameters: N/A Treatment response/outcome: Skilled palpation used to identify taut bands and trigger points.  Once identified, dry needling techniques used to treat these areas.  Twitch response ellicited along with palpable elongation of muscle.  Following treatment,  patient reported mild soreness in upper traps and low back.    12/17/22: Nustep  x 5 min level 5 (PT present to discuss status) Standing hamstring stretch 3 x 30 sec Standing quad stretch 3 x 30 sec Seated piriformis stretch 3 x 30 sec Trigger Point Dry-Needling  Treatment instructions: Expect mild to moderate muscle soreness. S/S of pneumothorax if dry needled over a lung field, and to seek immediate medical attention should they occur. Patient verbalized understanding of these instructions and education. Patient Consent Given: Yes Education handout provided: Yes Muscles treated: Lumbar multifidi,  Electrical stimulation performed: No Parameters: N/A Treatment response/outcome: Skilled palpation used to identify taut bands and trigger points.  Once identified, dry needling techniques used to treat these areas.  Twitch response ellicited along with palpable elongation of muscle.  Following treatment,  patient reported mild soreness in upper traps and low back.    12/15/22: Nustep  x 5 min level 5 (PT present to discuss status) Standing hamstring stretch 3  x 30 sec Standing quad stretch 3 x 30 sec Seated piriformis stretch 3 x 30 sec Trigger Point Dry-Needling  Treatment instructions: Expect mild to moderate muscle soreness. S/S of pneumothorax if dry needled over a lung field, and to seek immediate medical attention should they occur. Patient verbalized understanding of these instructions and education. Patient Consent Given: Yes Education handout provided: Yes Muscles treated: Lumbar multifidi,  Electrical stimulation performed: No Parameters: N/A Treatment response/outcome: Skilled palpation used to identify taut bands and trigger points.  Once identified, dry needling techniques used to treat these areas.  Twitch response ellicited along with palpable elongation of muscle.  Following treatment,  patient reported mild soreness in upper traps and low back.       No emotional/communication barriers or cognitive limitation. Patient is motivated to learn. Patient understands and agrees with treatment goals and plan. PT explains patient will be examined in standing, sitting, and lying down to see how their muscles and joints work. When they are ready, they will be asked to remove their underwear so PT can examine their perineum. The patient is also given the option of providing their own chaperone as one is not provided in our facility. The patient also has the right and is explained the right to defer or refuse any part of the evaluation or treatment including the internal exam. With the patient's consent, PT will use one gloved finger to gently assess the muscles of the pelvic floor, seeing how well it contracts and relaxes and if there is muscle symmetry. After, the patient will get dressed and PT and patient will discuss exam findings and plan of care. PT and patient discuss plan  of care, schedule, attendance policy and HEP activities.                  PATIENT EDUCATION:  Education details: [phone] Person educated: Patient Education method:  Explanation, Demonstration, Tactile cues, Verbal cues, and Handouts Education comprehension: verbalized understanding and returned demonstration  HOME EXERCISE PROGRAM: Access Code: [email or web address] URL: [email or web address] Date: 11/26/2022 Prepared by: [name] [name]  Exercises - Wall Angels  - 1 x daily - 7 x weekly - 1 sets - 3 reps - 30s holds - Cat Cow  - 1 x daily - 7 x weekly - 1 sets - 10 reps - Supine Diaphragmatic Breathing  - 1 x daily - 7 x weekly - 1 sets - 10 reps - Supine Transversus Abdominis Bracing - Hands on Stomach  - 1 x daily - 7 x weekly - 2 sets - 10 reps - Sidelying Thoracic Rotation with Open Book  - 1 x daily - 7 x weekly - 1 sets - 3 reps - 30s holds - Supine Hip Internal and External Rotation  - 1 x daily - 7 x weekly - 1 sets - 10 reps - Supine 90/90 Alternating Heel Touches with Posterior Pelvic Tilt  - 1 x daily - 7 x weekly - 2 sets - 10 reps - Supine Dead Bug with Leg Extension  - 1 x daily - 7 x weekly - 2 sets - 10 reps - Standing Hamstring Stretch on Chair  - 1 x daily - 7 x weekly - 1 sets - 3 reps - 30 sec hold - Standing Quad Stretch with Table and Chair Support  - 1 x daily - 7 x weekly - 1 sets - 3 reps - 30 hold ASSESSMENT:  CLINICAL IMPRESSION: [name] [name] had good response to DN with several days of minimal to no pain.  She was able to resume a lot of her usual workout activities.  She did have some return of symptoms on the right side but overall, she is doing very well and feeling like she is finally able to exercise.  She had good deep ache response to DN today and reported relief of symptoms post treatment. She also denies any bleeding with her workouts.    She would benefit from continued skilled PT for pelvic floor guidance post partum and to address low back pain.     OBJECTIVE IMPAIRMENTS: decreased coordination, decreased endurance, decreased mobility, decreased strength, increased fascial restrictions, impaired flexibility, impaired  sensation, improper body mechanics, postural dysfunction, and pain.   ACTIVITY LIMITATIONS: continence and exercise  PARTICIPATION LIMITATIONS: community activity  PERSONAL FACTORS: Fitness, Time since onset of injury/illness/exacerbation, and 1 comorbidity: x3 c-sections  are also affecting patient's functional outcome.   REHAB POTENTIAL: Good  CLINICAL DECISION MAKING: Evolving/moderate complexity  EVALUATION COMPLEXITY: Moderate   GOALS: Goals reviewed with patient? Yes  SHORT TERM GOALS: Target date: 12/08/22  Pt to be I with HEP.  Baseline: Goal status: INITIAL  2.  Pt to tolerate body weight squats with good technique and pressure management and denies bleeding.  Baseline:  Goal status: INITIAL   LONG TERM GOALS: Target date: 03/12/23  Pt to be I with advanced HEP.  Baseline:  Goal status: INITIAL  2.  Pt will report 50% reduction of pain due to improvements in posture, strength, and muscle length  Baseline: 10/10 at worst Goal status: INITIAL  3.  Pt to demonstrate at least 4/5 pelvic floor strength for improved pelvic stability and decreased strain at  pelvic floor/ decrease leakage.  Baseline:  Goal status: INITIAL  4.  Pt to demonstrate improved coordination of pelvic floor and breathing mechanics with 30# squat with appropriate synergistic patterns to decrease pain and leakage at least 75% of the time.    Baseline:  Goal status: INITIAL  5.  Pt to demonstrate at least 5/5 bil hip strength for improved pelvic stability and functional squats without leakage.  Baseline:  Goal status: INITIAL  6. Pt to report no more than one urinary leakage instance in a month for improved QOL and decreased leakage.  Baseline:  Goal status: INITIAL  PLAN:  PT FREQUENCY: 2x/week  PT DURATION: 10 weeks  PLANNED INTERVENTIONS: Therapeutic exercises, Therapeutic activity, Neuromuscular re-education, Patient/Family education, Self Care, Joint mobilization, DME instructions,  Aquatic Therapy, Dry Needling, Spinal mobilization, Cryotherapy, Moist heat, scar mobilization, Taping, Vasopneumatic device, Biofeedback, Ionotophoresis 4mg /ml Dexamethasone, Manual therapy, and Re-evaluation  PLAN FOR NEXT SESSION: Assess response to DN #3 to lumbar spine and gluteal. Continue to progress core and hip strengthening, coordination of pelvic floor and breathing, flexibility in back and hips,   Victorino Dike B. Breana Litts, PT 12/25/22 8:56 AM [facility] [address], [address] [place], [place] [address] Phone # [phone] Fax [phone]

## 2022-12-28 ENCOUNTER — Ambulatory Visit: Payer: Medicaid Other | Admitting: Physical Therapy

## 2022-12-28 DIAGNOSIS — R262 Difficulty in walking, not elsewhere classified: Secondary | ICD-10-CM | POA: Diagnosis not present

## 2022-12-28 DIAGNOSIS — R252 Cramp and spasm: Secondary | ICD-10-CM

## 2022-12-28 DIAGNOSIS — M6281 Muscle weakness (generalized): Secondary | ICD-10-CM

## 2022-12-28 DIAGNOSIS — R293 Abnormal posture: Secondary | ICD-10-CM

## 2022-12-28 NOTE — Therapy (Signed)
OUTPATIENT PHYSICAL THERAPY TREATMENT   Patient Name: [name] [name] MRN: [id number] DOB:1990/04/07, 32 y.o., female Today's Date: 12/28/2022  END OF SESSION:  PT End of Session - 12/28/22 1405     Visit Number 15    Date for PT Re-Evaluation 03/12/23    Authorization Type [place] MEDICAID [employer]    PT Start Time 1403    PT Stop Time 1445    PT Time Calculation (min) 42 min    Activity Tolerance Patient tolerated treatment well    Behavior During Therapy [facility] for tasks assessed/performed               Past Medical History:  Diagnosis Date   Abnormal Pap smear 03/09/2010   Genital warts    Past Surgical History:  Procedure Laterality Date   CESAREAN SECTION  2009   CESAREAN SECTION  08/12/2011   Procedure: CESAREAN SECTION;  Surgeon: Catalina Antigua, MD;  Location: [facility];  Service: Gynecology;  Laterality: N/A;   CESAREAN SECTION N/A 03/20/2022   Procedure: REPEAT CESAREAN SECTION;  Surgeon: Osborn Coho, MD;  Location: [facility];  Service: Obstetrics;  Laterality: N/A;   DILATION AND CURETTAGE OF UTERUS  2007   TUBAL LIGATION Bilateral 03/20/2022   Procedure: BILATERAL TUBAL LIGATION;  Surgeon: Osborn Coho, MD;  Location: [facility];  Service: Obstetrics;  Laterality: Bilateral;  Ligasure used on both tubes   Patient Active Problem List   Diagnosis Date Noted   Preeclampsia in postpartum period 03/26/2022   PPH (postpartum hemorrhage) 03/23/2022   Status post repeat low transverse cesarean section 03/20/2022   History of miscarriage x 2 03/14/2022   Penicillin allergy 03/14/2022   History of 2 cesarean sections 03/13/2022   Positive GBS test 03/13/2022   LGA (large for gestational age) fetus affecting management of mother 03/13/2022   Family history of hypertrophic cardiomyopathy 12/17/2016   Family history of Wolff-Parkinson-White (WPW) syndrome 12/17/2016    PCP: none per chart  REFERRING PROVIDER: Hands, Kylie, NP   REFERRING DIAG:  M62.9 (ICD-10-CM) - Disorder of muscle, unspecified  THERAPY DIAG:  Muscle weakness (generalized)  Abnormal posture  Cramp and spasm  Rationale for Evaluation and Treatment: Rehabilitation  ONSET DATE: 7 months ago  SUBJECTIVE:                                                                                                                                                                                           SUBJECTIVE STATEMENT: Reports dry needling in glutes relived pain for 2 days but dry needling in the back didn't make a big difference. However other than this back  pain remains. No longer having spotting.   Fluid intake: Yes: water - a gallon day, a lot of caffeine right now (2 cups day) usually did not due caffeine .    PAIN:  Are you having pain? Yes NPRS scale: 7/10 Pain location:  whole back  a lot of tension in mid spine  Pain type: achy Pain description: constant   Aggravating factors: everything Alleviating: nothing   PRECAUTIONS: Other: postpartum  RED FLAGS: None   WEIGHT BEARING RESTRICTIONS: No  FALLS:  Has patient fallen in last 6 months? No  LIVING ENVIRONMENT: Lives with: lives with their family Lives in: House/apartment   OCCUPATION: not currently   PLOF: Independent  PATIENT GOALS: to be stronger especially at pelvic floor, I want to get back to consistent strength training.   PERTINENT HISTORY:  Preeclampsia in postpartum period, 3 c-sections Sexual abuse: No  BOWEL MOVEMENT: Pain with bowel movement: Yes Type of bowel movement:Type (Bristol Stool Scale) 4, Frequency daily, and Strain No Fully empty rectum: No Leakage: No Pads: No Fiber supplement: No  URINATION: Pain with urination: No Fully empty bladder: Yes:   Stream: Strong Urgency: Yes:   Frequency: 2x hourly; not really, gets up for baby  and may go to the bathroom.  Leakage: Urge to void Pads: No  INTERCOURSE: Pain with intercourse:  not painful, denies dryness,  no bleeding  Ability to have vaginal penetration:  Yes:   Climax: not painful Marinoff Scale: 0/3  PREGNANCY: Vaginal deliveries 0  C-section deliveries 3 Currently pregnant No  PROLAPSE: None   OBJECTIVE:   DIAGNOSTIC FINDINGS:  Ultrasound done last week - (-)  PATIENT SURVEYS:   PFIQ-7 29  COGNITION: Overall cognitive status: Within functional limits for tasks assessed     SENSATION: Light touch: Appears intact Proprioception: Appears intact  MUSCLE LENGTH: Bil hamstrings and adductors limited by 25%   POSTURE: rounded shoulders, forward head, and anterior pelvic tilt  PELVIC ALIGNMENT: WFL  LUMBARAROM/PROM:  A/PROM A/PROM  eval  Flexion WFL  Extension WFL  Right lateral flexion Limited by 25%  Left lateral flexion Limited by 25%  Right rotation Limited by 25%  Left rotation Limited by 25%   (Blank rows = not tested)  LOWER EXTREMITY ROM:  WFL  LOWER EXTREMITY MMT:  Bil hips grossly 4/5; knees 5/5  PALPATION:   General  no TTP, but did have some tension at lumbar spine and glutes C-section scar limited in all quadrants and in all directions.                 External Perineal Exam - no pain, [facility]                             Internal Pelvic Floor no pain  Patient confirms identification and approves PT to assess internal pelvic floor and treatment Yes  PELVIC MMT:   MMT eval 11/24/22   Vaginal  3/5; 3s; 4x  Internal Anal Sphincter    External Anal Sphincter    Puborectalis    Diastasis Recti <1 finger separation above and below with active crunch    (Blank rows = not tested)        TONE: WFL  PROLAPSE: Not seen in hooklying with cough   TODAY'S TREATMENT:  DATE:  12/28/22: Foam rolling x10 glutes, x10 piriformis bil, x10 glute med each 3x1 min isometric glute med holds in half knee with ball press x15 6#  (reported back pain, decreased to 5#) 3 way hip intermittent UE support as needed Golfers carry 15# x10 each Pigeon stretch 2x30s each Hip 90/90 2x30s each   12/25/22: Nustep  x 5 min level 5 (PT present to discuss status) Standing hamstring stretch 3 x 30 sec Standing quad stretch 3 x 30 sec Seated piriformis stretch 3 x 30 sec Trigger Point Dry-Needling  Treatment instructions: Expect mild to moderate muscle soreness. S/S of pneumothorax if dry needled over a lung field, and to seek immediate medical attention should they occur. Patient verbalized understanding of these instructions and education. Patient Consent Given: Yes Education handout provided: Yes Muscles treated: Lumbar multifidi bilaterally, glut max, min and medius on right  Electrical stimulation performed: No Parameters: N/A Treatment response/outcome: Skilled palpation used to identify taut bands and trigger points.  Once identified, dry needling techniques used to treat these areas.  Twitch response ellicited along with palpable elongation of muscle.  Following treatment,  patient reported mild soreness in upper traps and low back.    12/17/22: Nustep  x 5 min level 5 (PT present to discuss status) Standing hamstring stretch 3 x 30 sec Standing quad stretch 3 x 30 sec Seated piriformis stretch 3 x 30 sec Trigger Point Dry-Needling  Treatment instructions: Expect mild to moderate muscle soreness. S/S of pneumothorax if dry needled over a lung field, and to seek immediate medical attention should they occur. Patient verbalized understanding of these instructions and education. Patient Consent Given: Yes Education handout provided: Yes Muscles treated: Lumbar multifidi,  Electrical stimulation performed: No Parameters: N/A Treatment response/outcome: Skilled palpation used to identify taut bands and trigger points.  Once identified, dry needling techniques used to treat these areas.  Twitch response ellicited along with  palpable elongation of muscle.  Following treatment,  patient reported mild soreness in upper traps and low back.        No emotional/communication barriers or cognitive limitation. Patient is motivated to learn. Patient understands and agrees with treatment goals and plan. PT explains patient will be examined in standing, sitting, and lying down to see how their muscles and joints work. When they are ready, they will be asked to remove their underwear so PT can examine their perineum. The patient is also given the option of providing their own chaperone as one is not provided in our facility. The patient also has the right and is explained the right to defer or refuse any part of the evaluation or treatment including the internal exam. With the patient's consent, PT will use one gloved finger to gently assess the muscles of the pelvic floor, seeing how well it contracts and relaxes and if there is muscle symmetry. After, the patient will get dressed and PT and patient will discuss exam findings and plan of care. PT and patient discuss plan of care, schedule, attendance policy and HEP activities.                  PATIENT EDUCATION:  Education details: [phone] Person educated: Patient Education method: Explanation, Demonstration, Tactile cues, Verbal cues, and Handouts Education comprehension: verbalized understanding and returned demonstration  HOME EXERCISE PROGRAM: Access Code: [email or web address] URL: [email or web address] Date: 11/26/2022 Prepared by: Mikey Kirschner  Exercises - Wall Angels  - 1 x daily - 7 x weekly - 1 sets - 3 reps -  30s holds - Cat Cow  - 1 x daily - 7 x weekly - 1 sets - 10 reps - Supine Diaphragmatic Breathing  - 1 x daily - 7 x weekly - 1 sets - 10 reps - Supine Transversus Abdominis Bracing - Hands on Stomach  - 1 x daily - 7 x weekly - 2 sets - 10 reps - Sidelying Thoracic Rotation with Open Book  - 1 x daily - 7 x weekly - 1 sets - 3 reps - 30s holds -  Supine Hip Internal and External Rotation  - 1 x daily - 7 x weekly - 1 sets - 10 reps - Supine 90/90 Alternating Heel Touches with Posterior Pelvic Tilt  - 1 x daily - 7 x weekly - 2 sets - 10 reps - Supine Dead Bug with Leg Extension  - 1 x daily - 7 x weekly - 2 sets - 10 reps - Standing Hamstring Stretch on Chair  - 1 x daily - 7 x weekly - 1 sets - 3 reps - 30 sec hold - Standing Quad Stretch with Table and Chair Support  - 1 x daily - 7 x weekly - 1 sets - 3 reps - 30 hold ASSESSMENT:  CLINICAL IMPRESSION: pt presents for treatment, focus on glute med strengthening and stretching for improved pelvic stability. Pt tolerated well did verbalize these exercises were difficult and did replicate some if her back pain with too heavy of a weight (decreased improved pain). Pt benefited from cues for technique throughout and improved activation of core. She would benefit from continued skilled PT for pelvic floor guidance post partum and to address low back pain.     OBJECTIVE IMPAIRMENTS: decreased coordination, decreased endurance, decreased mobility, decreased strength, increased fascial restrictions, impaired flexibility, impaired sensation, improper body mechanics, postural dysfunction, and pain.   ACTIVITY LIMITATIONS: continence and exercise  PARTICIPATION LIMITATIONS: community activity  PERSONAL FACTORS: Fitness, Time since onset of injury/illness/exacerbation, and 1 comorbidity: x3 c-sections  are also affecting patient's functional outcome.   REHAB POTENTIAL: Good  CLINICAL DECISION MAKING: Evolving/moderate complexity  EVALUATION COMPLEXITY: Moderate   GOALS: Goals reviewed with patient? Yes  SHORT TERM GOALS: Target date: 12/08/22  Pt to be I with HEP.  Baseline: Goal status: MET  2.  Pt to tolerate body weight squats with good technique and pressure management and denies bleeding.  Baseline:  Goal status: MET   LONG TERM GOALS: Target date: 03/12/23  Pt to be I with  advanced HEP.  Baseline:  Goal status: on going  2.  Pt will report 50% reduction of pain due to improvements in posture, strength, and muscle length  Baseline: 10/10 at worst Goal status: on going  3.  Pt to demonstrate at least 4/5 pelvic floor strength for improved pelvic stability and decreased strain at pelvic floor/ decrease leakage.  Baseline:  Goal status: on going  4.  Pt to demonstrate improved coordination of pelvic floor and breathing mechanics with 30# squat with appropriate synergistic patterns to decrease pain and leakage at least 75% of the time.    Baseline:  Goal status: on going  5.  Pt to demonstrate at least 5/5 bil hip strength for improved pelvic stability and functional squats without leakage.  Baseline:  Goal status: on going  6. Pt to report no more than one urinary leakage instance in a month for improved QOL and decreased leakage.  Baseline:  Goal status: on going  PLAN:  PT FREQUENCY: 2x/week  PT DURATION: 10 weeks  PLANNED INTERVENTIONS: Therapeutic exercises, Therapeutic activity, Neuromuscular re-education, Patient/Family education, Self Care, Joint mobilization, DME instructions, Aquatic Therapy, Dry Needling, Spinal mobilization, Cryotherapy, Moist heat, scar mobilization, Taping, Vasopneumatic device, Biofeedback, Ionotophoresis 4mg /ml Dexamethasone, Manual therapy, and Re-evaluation  PLAN FOR NEXT SESSION: Assess response to DN #3 to lumbar spine and gluteal. (Reports better reduction in pain with gluteal needling. Also target glute meds for strengthening. Continue to progress core and hip strengthening, coordination of pelvic floor and breathing, flexibility in back and hips,   Otelia Sergeant, PT, DPT 10/21/242:55 PM  [facility] [address] [place], [place] [address] Phone # [phone] Fax [phone]

## 2022-12-31 ENCOUNTER — Ambulatory Visit: Payer: Medicaid Other

## 2023-01-04 ENCOUNTER — Ambulatory Visit: Payer: Medicaid Other | Admitting: Physical Therapy

## 2023-01-04 ENCOUNTER — Encounter: Payer: Self-pay | Admitting: Physical Therapy

## 2023-01-04 DIAGNOSIS — M6281 Muscle weakness (generalized): Secondary | ICD-10-CM

## 2023-01-04 DIAGNOSIS — M5459 Other low back pain: Secondary | ICD-10-CM

## 2023-01-04 DIAGNOSIS — R293 Abnormal posture: Secondary | ICD-10-CM

## 2023-01-04 DIAGNOSIS — R262 Difficulty in walking, not elsewhere classified: Secondary | ICD-10-CM | POA: Diagnosis not present

## 2023-01-04 NOTE — Therapy (Signed)
OUTPATIENT PHYSICAL THERAPY TREATMENT   Patient Name: [name] [name] MRN: [id number] DOB:[birth year], 32 y.o., female Today's Date: 01/04/2023  END OF SESSION:  PT End of Session - 01/04/23 1622     Visit Number 16    Date for PT Re-Evaluation 03/12/23    Authorization Type [place] MEDICAID [employer]    PT Start Time 1620    PT Stop Time 1652    PT Time Calculation (min) 32 min    Activity Tolerance Patient tolerated treatment well    Behavior During Therapy [facility] for tasks assessed/performed               Past Medical History:  Diagnosis Date   Abnormal Pap smear 03/09/2010   Genital warts    Past Surgical History:  Procedure Laterality Date   CESAREAN SECTION  2009   CESAREAN SECTION  08/12/2011   Procedure: CESAREAN SECTION;  Surgeon: Catalina Antigua, MD;  Location: [facility];  Service: Gynecology;  Laterality: N/A;   CESAREAN SECTION N/A 03/20/2022   Procedure: REPEAT CESAREAN SECTION;  Surgeon: Osborn Coho, MD;  Location: [facility];  Service: Obstetrics;  Laterality: N/A;   DILATION AND CURETTAGE OF UTERUS  2007   TUBAL LIGATION Bilateral 03/20/2022   Procedure: BILATERAL TUBAL LIGATION;  Surgeon: Osborn Coho, MD;  Location: [facility];  Service: Obstetrics;  Laterality: Bilateral;  Ligasure used on both tubes   Patient Active Problem List   Diagnosis Date Noted   Preeclampsia in postpartum period 03/26/2022   PPH (postpartum hemorrhage) 03/23/2022   Status post repeat low transverse cesarean section 03/20/2022   History of miscarriage x 2 03/14/2022   Penicillin allergy 03/14/2022   History of 2 cesarean sections 03/13/2022   Positive GBS test 03/13/2022   LGA (large for gestational age) fetus affecting management of mother 03/13/2022   Family history of hypertrophic cardiomyopathy 12/17/2016   Family history of Wolff-Parkinson-White (WPW) syndrome 12/17/2016    PCP: none per chart  REFERRING PROVIDER: Hands, Kylie, NP   REFERRING DIAG:  M62.9 (ICD-10-CM) - Disorder of muscle, unspecified  THERAPY DIAG:  Muscle weakness (generalized)  Abnormal posture  Other low back pain  Rationale for Evaluation and Treatment: Rehabilitation  ONSET DATE: 7 months ago  SUBJECTIVE:                                                                                                                                                                                           SUBJECTIVE STATEMENT: Pt reports she just finished period and thinks her back is feeling better today than it was last week. Also reports she took it easy  with workouts this week and thinks this helps.   Fluid intake: Yes: water - a gallon day, a lot of caffeine right now (2 cups day) usually did not due caffeine .    PAIN:  Are you having pain? Yes NPRS scale: 4/10 Pain location:  whole back  a lot of tension in mid spine  Pain type: achy Pain description: constant   Aggravating factors: everything Alleviating: nothing   PRECAUTIONS: Other: postpartum  RED FLAGS: None   WEIGHT BEARING RESTRICTIONS: No  FALLS:  Has patient fallen in last 6 months? No  LIVING ENVIRONMENT: Lives with: lives with their family Lives in: House/apartment   OCCUPATION: not currently   PLOF: Independent  PATIENT GOALS: to be stronger especially at pelvic floor, I want to get back to consistent strength training.   PERTINENT HISTORY:  Preeclampsia in postpartum period, 3 c-sections Sexual abuse: No  BOWEL MOVEMENT: Pain with bowel movement: Yes Type of bowel movement:Type (Bristol Stool Scale) 4, Frequency daily, and Strain No Fully empty rectum: No Leakage: No Pads: No Fiber supplement: No  URINATION: Pain with urination: No Fully empty bladder: Yes:   Stream: Strong Urgency: Yes:   Frequency: 2x hourly; not really, gets up for baby  and may go to the bathroom.  Leakage: Urge to void Pads: No  INTERCOURSE: Pain with intercourse:  not painful, denies dryness,  no bleeding  Ability to have vaginal penetration:  Yes:   Climax: not painful Marinoff Scale: 0/3  PREGNANCY: Vaginal deliveries 0  C-section deliveries 3 Currently pregnant No  PROLAPSE: None   OBJECTIVE:   DIAGNOSTIC FINDINGS:  Ultrasound done last week - (-)  PATIENT SURVEYS:   PFIQ-7 29  COGNITION: Overall cognitive status: Within functional limits for tasks assessed     SENSATION: Light touch: Appears intact Proprioception: Appears intact  MUSCLE LENGTH: Bil hamstrings and adductors limited by 25%   POSTURE: rounded shoulders, forward head, and anterior pelvic tilt  PELVIC ALIGNMENT: WFL  LUMBARAROM/PROM:  A/PROM A/PROM  eval  Flexion WFL  Extension WFL  Right lateral flexion Limited by 25%  Left lateral flexion Limited by 25%  Right rotation Limited by 25%  Left rotation Limited by 25%   (Blank rows = not tested)  LOWER EXTREMITY ROM:  WFL  LOWER EXTREMITY MMT:  Bil hips grossly 4/5; knees 5/5  PALPATION:   General  no TTP, but did have some tension at lumbar spine and glutes C-section scar limited in all quadrants and in all directions.                 External Perineal Exam - no pain, [facility]                             Internal Pelvic Floor no pain  Patient confirms identification and approves PT to assess internal pelvic floor and treatment Yes  PELVIC MMT:   MMT eval 11/24/22   Vaginal  3/5; 3s; 4x  Internal Anal Sphincter    External Anal Sphincter    Puborectalis    Diastasis Recti <1 finger separation above and below with active crunch    (Blank rows = not tested)        TONE: WFL  PROLAPSE: Not seen in hooklying with cough   TODAY'S TREATMENT:  DATE:  01/04/23:  Manual -  c-section scar site with pt in prone with diaphragmatic breathing throughout. Gentle manual scar tissue mobility completed  over scar. Fascial release completed here as well. Suction cup used intermittently for improved tissue mobility as well. Majority of session at midline and above midline to scar site with this most restricted area   12/28/22: Foam rolling x10 glutes, x10 piriformis bil, x10 glute med each 3x1 min isometric glute med holds in half knee with ball press x15 6# (reported back pain, decreased to 5#) 3 way hip intermittent UE support as needed Golfers carry 15# x10 each Pigeon stretch 2x30s each Hip 90/90 2x30s each   12/25/22: Nustep  x 5 min level 5 (PT present to discuss status) Standing hamstring stretch 3 x 30 sec Standing quad stretch 3 x 30 sec Seated piriformis stretch 3 x 30 sec Trigger Point Dry-Needling  Treatment instructions: Expect mild to moderate muscle soreness. S/S of pneumothorax if dry needled over a lung field, and to seek immediate medical attention should they occur. Patient verbalized understanding of these instructions and education. Patient Consent Given: Yes Education handout provided: Yes Muscles treated: Lumbar multifidi bilaterally, glut max, min and medius on right  Electrical stimulation performed: No Parameters: N/A Treatment response/outcome: Skilled palpation used to identify taut bands and trigger points.  Once identified, dry needling techniques used to treat these areas.  Twitch response ellicited along with palpable elongation of muscle.  Following treatment,  patient reported mild soreness in upper traps and low back.     No emotional/communication barriers or cognitive limitation. Patient is motivated to learn. Patient understands and agrees with treatment goals and plan. PT explains patient will be examined in standing, sitting, and lying down to see how their muscles and joints work. When they are ready, they will be asked to remove their underwear so PT can examine their perineum. The patient is also given the option of providing their own chaperone as  one is not provided in our facility. The patient also has the right and is explained the right to defer or refuse any part of the evaluation or treatment including the internal exam. With the patient's consent, PT will use one gloved finger to gently assess the muscles of the pelvic floor, seeing how well it contracts and relaxes and if there is muscle symmetry. After, the patient will get dressed and PT and patient will discuss exam findings and plan of care. PT and patient discuss plan of care, schedule, attendance policy and HEP activities.                  PATIENT EDUCATION:  Education details: [phone] Person educated: Patient Education method: Explanation, Demonstration, Tactile cues, Verbal cues, and Handouts Education comprehension: verbalized understanding and returned demonstration  HOME EXERCISE PROGRAM: Access Code: [email or web address] URL: [email or web address] Date: 11/26/2022 Prepared by: Mikey Kirschner  Exercises - Wall Angels  - 1 x daily - 7 x weekly - 1 sets - 3 reps - 30s holds - Cat Cow  - 1 x daily - 7 x weekly - 1 sets - 10 reps - Supine Diaphragmatic Breathing  - 1 x daily - 7 x weekly - 1 sets - 10 reps - Supine Transversus Abdominis Bracing - Hands on Stomach  - 1 x daily - 7 x weekly - 2 sets - 10 reps - Sidelying Thoracic Rotation with Open Book  - 1 x daily - 7 x weekly - 1 sets - 3 reps -  30s holds - Supine Hip Internal and External Rotation  - 1 x daily - 7 x weekly - 1 sets - 10 reps - Supine 90/90 Alternating Heel Touches with Posterior Pelvic Tilt  - 1 x daily - 7 x weekly - 2 sets - 10 reps - Supine Dead Bug with Leg Extension  - 1 x daily - 7 x weekly - 2 sets - 10 reps - Standing Hamstring Stretch on Chair  - 1 x daily - 7 x weekly - 1 sets - 3 reps - 30 sec hold - Standing Quad Stretch with Table and Chair Support  - 1 x daily - 7 x weekly - 1 sets - 3 reps - 30 hold ASSESSMENT:  CLINICAL IMPRESSION: pt presents for treatment, focus on manual for  scar mobility and cues for diaphragmatic breathing to improved stretch and mobility at scar site. Pt tolerated well and is progressing with mobility here but does still remain tight. She would benefit from continued skilled PT for pelvic floor guidance post partum and to address low back pain.     OBJECTIVE IMPAIRMENTS: decreased coordination, decreased endurance, decreased mobility, decreased strength, increased fascial restrictions, impaired flexibility, impaired sensation, improper body mechanics, postural dysfunction, and pain.   ACTIVITY LIMITATIONS: continence and exercise  PARTICIPATION LIMITATIONS: community activity  PERSONAL FACTORS: Fitness, Time since onset of injury/illness/exacerbation, and 1 comorbidity: x3 c-sections  are also affecting patient's functional outcome.   REHAB POTENTIAL: Good  CLINICAL DECISION MAKING: Evolving/moderate complexity  EVALUATION COMPLEXITY: Moderate   GOALS: Goals reviewed with patient? Yes  SHORT TERM GOALS: Target date: 12/08/22  Pt to be I with HEP.  Baseline: Goal status: MET  2.  Pt to tolerate body weight squats with good technique and pressure management and denies bleeding.  Baseline:  Goal status: MET   LONG TERM GOALS: Target date: 03/12/23  Pt to be I with advanced HEP.  Baseline:  Goal status: on going  2.  Pt will report 50% reduction of pain due to improvements in posture, strength, and muscle length  Baseline: 10/10 at worst Goal status: on going  3.  Pt to demonstrate at least 4/5 pelvic floor strength for improved pelvic stability and decreased strain at pelvic floor/ decrease leakage.  Baseline:  Goal status: on going  4.  Pt to demonstrate improved coordination of pelvic floor and breathing mechanics with 30# squat with appropriate synergistic patterns to decrease pain and leakage at least 75% of the time.    Baseline:  Goal status: on going  5.  Pt to demonstrate at least 5/5 bil hip strength for improved  pelvic stability and functional squats without leakage.  Baseline:  Goal status: on going  6. Pt to report no more than one urinary leakage instance in a month for improved QOL and decreased leakage.  Baseline:  Goal status: on going  PLAN:  PT FREQUENCY: 2x/week  PT DURATION: 10 weeks  PLANNED INTERVENTIONS: Therapeutic exercises, Therapeutic activity, Neuromuscular re-education, Patient/Family education, Self Care, Joint mobilization, DME instructions, Aquatic Therapy, Dry Needling, Spinal mobilization, Cryotherapy, Moist heat, scar mobilization, Taping, Vasopneumatic device, Biofeedback, Ionotophoresis 4mg /ml Dexamethasone, Manual therapy, and Re-evaluation  PLAN FOR NEXT SESSION: Assess response to DN #3 to lumbar spine and gluteal. (Reports better reduction in pain with gluteal needling. Also target glute meds for strengthening. Continue to progress core and hip strengthening, coordination of pelvic floor and breathing, flexibility in back and hips,   Otelia Sergeant, PT, DPT 10/28/244:54 PM  [facility] [address], [address] [place], [place] [address] Phone # [phone] Fax [phone]

## 2023-01-06 ENCOUNTER — Other Ambulatory Visit: Payer: Self-pay | Admitting: Obstetrics & Gynecology

## 2023-01-07 ENCOUNTER — Ambulatory Visit: Payer: Medicaid Other

## 2023-01-07 DIAGNOSIS — R293 Abnormal posture: Secondary | ICD-10-CM

## 2023-01-07 DIAGNOSIS — R262 Difficulty in walking, not elsewhere classified: Secondary | ICD-10-CM | POA: Diagnosis not present

## 2023-01-07 DIAGNOSIS — M5459 Other low back pain: Secondary | ICD-10-CM

## 2023-01-07 DIAGNOSIS — M6281 Muscle weakness (generalized): Secondary | ICD-10-CM

## 2023-01-07 DIAGNOSIS — R252 Cramp and spasm: Secondary | ICD-10-CM

## 2023-01-07 NOTE — Therapy (Signed)
OUTPATIENT PHYSICAL THERAPY FEMALE PELVIC TREATMENT   Patient Name: [name] [name] MRN: [id number] DOB:05/26/1990, 32 y.o., female Today's Date: 01/07/2023  END OF SESSION:  PT End of Session - 01/07/23 0907     Visit Number 17    Date for PT Re-Evaluation 03/12/23    Authorization Type [place] MEDICAID [employer]    PT Start Time 0847    PT Stop Time 0935    PT Time Calculation (min) 48 min    Activity Tolerance Patient tolerated treatment well    Behavior During Therapy [facility] for tasks assessed/performed               Past Medical History:  Diagnosis Date   Abnormal Pap smear 03/09/2010   Genital warts    Past Surgical History:  Procedure Laterality Date   CESAREAN SECTION  2009   CESAREAN SECTION  08/12/2011   Procedure: CESAREAN SECTION;  Surgeon: Catalina Antigua, MD;  Location: [facility];  Service: Gynecology;  Laterality: N/A;   CESAREAN SECTION N/A 03/20/2022   Procedure: REPEAT CESAREAN SECTION;  Surgeon: Osborn Coho, MD;  Location: [facility];  Service: Obstetrics;  Laterality: N/A;   DILATION AND CURETTAGE OF UTERUS  2007   TUBAL LIGATION Bilateral 03/20/2022   Procedure: BILATERAL TUBAL LIGATION;  Surgeon: Osborn Coho, MD;  Location: [facility];  Service: Obstetrics;  Laterality: Bilateral;  Ligasure used on both tubes   Patient Active Problem List   Diagnosis Date Noted   Preeclampsia in postpartum period 03/26/2022   PPH (postpartum hemorrhage) 03/23/2022   Status post repeat low transverse cesarean section 03/20/2022   History of miscarriage x 2 03/14/2022   Penicillin allergy 03/14/2022   History of 2 cesarean sections 03/13/2022   Positive GBS test 03/13/2022   LGA (large for gestational age) fetus affecting management of mother 03/13/2022   Family history of hypertrophic cardiomyopathy 12/17/2016   Family history of Wolff-Parkinson-White (WPW) syndrome 12/17/2016    PCP: none per chart  REFERRING PROVIDER: Hands, Kylie,  NP   REFERRING DIAG: M62.9 (ICD-10-CM) - Disorder of muscle, unspecified  THERAPY DIAG:  Muscle weakness (generalized)  Abnormal posture  Other low back pain  Cramp and spasm  Rationale for Evaluation and Treatment: Rehabilitation  ONSET DATE: 7 months ago  SUBJECTIVE:                                                                                                                                                                                           SUBJECTIVE STATEMENT: Pt states her back continues to hurt but she did get some relief with the dry needling when we  focused on her glutes.    Fluid intake: Yes: water - a gallon day, a lot of caffeine right now (2 cups day) usually did not due caffeine .    PAIN:  Are you having pain? Yes NPRS scale: 7/10 Pain location:  whole back  a lot of tension in mid spine  Pain type: achy Pain description: constant   Aggravating factors: everything Alleviating: nothing   PRECAUTIONS: Other: postpartum  RED FLAGS: None   WEIGHT BEARING RESTRICTIONS: No  FALLS:  Has patient fallen in last 6 months? No  LIVING ENVIRONMENT: Lives with: lives with their family Lives in: House/apartment   OCCUPATION: not currently   PLOF: Independent  PATIENT GOALS: to be stronger especially at pelvic floor, I want to get back to consistent strength training.   PERTINENT HISTORY:  Preeclampsia in postpartum period, 3 c-sections Sexual abuse: No  BOWEL MOVEMENT: Pain with bowel movement: Yes Type of bowel movement:Type (Bristol Stool Scale) 4, Frequency daily, and Strain No Fully empty rectum: No Leakage: No Pads: No Fiber supplement: No  URINATION: Pain with urination: No Fully empty bladder: Yes:   Stream: Strong Urgency: Yes:   Frequency: 2x hourly; not really, gets up for baby  and may go to the bathroom.  Leakage: Urge to void Pads: No  INTERCOURSE: Pain with intercourse:  not painful, denies dryness, no bleeding   Ability to have vaginal penetration:  Yes:   Climax: not painful Marinoff Scale: 0/3  PREGNANCY: Vaginal deliveries 0  C-section deliveries 3 Currently pregnant No  PROLAPSE: None   OBJECTIVE:   DIAGNOSTIC FINDINGS:  Ultrasound done last week - (-)  PATIENT SURVEYS:   PFIQ-7 29  COGNITION: Overall cognitive status: Within functional limits for tasks assessed     SENSATION: Light touch: Appears intact Proprioception: Appears intact  MUSCLE LENGTH: Bil hamstrings and adductors limited by 25%   POSTURE: rounded shoulders, forward head, and anterior pelvic tilt  PELVIC ALIGNMENT: WFL  LUMBARAROM/PROM:  A/PROM A/PROM  eval  Flexion WFL  Extension WFL  Right lateral flexion Limited by 25%  Left lateral flexion Limited by 25%  Right rotation Limited by 25%  Left rotation Limited by 25%   (Blank rows = not tested)  LOWER EXTREMITY ROM:  WFL  LOWER EXTREMITY MMT:  Bil hips grossly 4/5; knees 5/5  PALPATION:   General  no TTP, but did have some tension at lumbar spine and glutes C-section scar limited in all quadrants and in all directions.                 External Perineal Exam - no pain, [facility]                             Internal Pelvic Floor no pain  Patient confirms identification and approves PT to assess internal pelvic floor and treatment No  PELVIC MMT:   MMT eval 11/24/22   Vaginal  3/5; 3s; 4x  Internal Anal Sphincter    External Anal Sphincter    Puborectalis    Diastasis Recti <1 finger separation above and below with active crunch    (Blank rows = not tested)        TONE: WFL  PROLAPSE: Not seen in hooklying with cough   TODAY'S TREATMENT:  DATE:  01/06/23: Nustep  x 5 min level 5 (PT present to discuss status) Standing hamstring stretch 3 x 30 sec Standing quad stretch 3 x 30 sec Seated 3 way ball  rolls holding 5 seconds each 5 times each direction Trigger Point Dry-Needling  Treatment instructions: Expect mild to moderate muscle soreness. S/S of pneumothorax if dry needled over a lung field, and to seek immediate medical attention should they occur. Patient verbalized understanding of these instructions and education. Patient Consent Given: Yes Education handout provided: Yes Muscles treated: Glut min, max and piriformis bilaterally Electrical stimulation performed: No Parameters: N/A Treatment response/outcome: Skilled palpation used to identify taut bands and trigger points.  Once identified, dry needling techniques used to treat these areas.  Twitch response ellicited along with palpable elongation of muscle left > right.  Following treatment,  patient reported mild soreness in left glutes.   Ice to lumbar spine at end of session x 10 min in supine hook lying.    12/17/22: Nustep  x 5 min level 5 (PT present to discuss status) Standing hamstring stretch 3 x 30 sec Standing quad stretch 3 x 30 sec Seated piriformis stretch 3 x 30 sec Trigger Point Dry-Needling  Treatment instructions: Expect mild to moderate muscle soreness. S/S of pneumothorax if dry needled over a lung field, and to seek immediate medical attention should they occur. Patient verbalized understanding of these instructions and education. Patient Consent Given: Yes Education handout provided: Yes Muscles treated: Lumbar multifidi,  Electrical stimulation performed: No Parameters: N/A Treatment response/outcome: Skilled palpation used to identify taut bands and trigger points.  Once identified, dry needling techniques used to treat these areas.  Twitch response ellicited along with palpable elongation of muscle.  Following treatment,  patient reported mild soreness in upper traps and low back.    12/15/22: Nustep  x 5 min level 5 (PT present to discuss status) Standing hamstring stretch 3 x 30 sec Standing quad  stretch 3 x 30 sec Seated piriformis stretch 3 x 30 sec Trigger Point Dry-Needling  Treatment instructions: Expect mild to moderate muscle soreness. S/S of pneumothorax if dry needled over a lung field, and to seek immediate medical attention should they occur. Patient verbalized understanding of these instructions and education. Patient Consent Given: Yes Education handout provided: Yes Muscles treated: Lumbar multifidi,  Electrical stimulation performed: No Parameters: N/A Treatment response/outcome: Skilled palpation used to identify taut bands and trigger points.  Once identified, dry needling techniques used to treat these areas.  Twitch response ellicited along with palpable elongation of muscle.  Following treatment,  patient reported mild soreness in upper traps and low back.    12/14/22: Moist heat at mid and low back - during stretches, no adverse reaction post removal Happy baby 3x30s Thoracic openers 3x30s each Wind shield wipes Cat/cow x10  Seated TA activations with exhale 2x10 - added ball squeeze as pt demonstrated difficulty contracting TA this improved it Manual - c-section scar site with pt in trunk extension over bosu ball with diaphragmatic breathing throughout, with increased tolerance pt progressed to bil UE over head as well to improve stretch. Gentle manual scar tissue mobility completed over scar, medial Rt and Lt quadrants of scar most tight today and for about 2 inches above scarring pt demonstrated continued fascial restrictions. Fascial release completed here as well. Suction cup used intermittently for improved tissue mobility as well.     No emotional/communication barriers or cognitive limitation. Patient is motivated to learn. Patient understands and  agrees with treatment goals and plan. PT explains patient will be examined in standing, sitting, and lying down to see how their muscles and joints work. When they are ready, they will be asked to remove their  underwear so PT can examine their perineum. The patient is also given the option of providing their own chaperone as one is not provided in our facility. The patient also has the right and is explained the right to defer or refuse any part of the evaluation or treatment including the internal exam. With the patient's consent, PT will use one gloved finger to gently assess the muscles of the pelvic floor, seeing how well it contracts and relaxes and if there is muscle symmetry. After, the patient will get dressed and PT and patient will discuss exam findings and plan of care. PT and patient discuss plan of care, schedule, attendance policy and HEP activities.                  PATIENT EDUCATION:  Education details: [phone] Person educated: Patient Education method: Explanation, Demonstration, Tactile cues, Verbal cues, and Handouts Education comprehension: verbalized understanding and returned demonstration  HOME EXERCISE PROGRAM: Access Code: [email or web address] URL: [email or web address] Date: 11/26/2022 Prepared by: Mikey Kirschner  Exercises - Wall Angels  - 1 x daily - 7 x weekly - 1 sets - 3 reps - 30s holds - Cat Cow  - 1 x daily - 7 x weekly - 1 sets - 10 reps - Supine Diaphragmatic Breathing  - 1 x daily - 7 x weekly - 1 sets - 10 reps - Supine Transversus Abdominis Bracing - Hands on Stomach  - 1 x daily - 7 x weekly - 2 sets - 10 reps - Sidelying Thoracic Rotation with Open Book  - 1 x daily - 7 x weekly - 1 sets - 3 reps - 30s holds - Supine Hip Internal and External Rotation  - 1 x daily - 7 x weekly - 1 sets - 10 reps - Supine 90/90 Alternating Heel Touches with Posterior Pelvic Tilt  - 1 x daily - 7 x weekly - 2 sets - 10 reps - Supine Dead Bug with Leg Extension  - 1 x daily - 7 x weekly - 2 sets - 10 reps - Standing Hamstring Stretch on Chair  - 1 x daily - 7 x weekly - 1 sets - 3 reps - 30 sec hold - Standing Quad Stretch with Table and Chair Support  - 1 x daily - 7 x  weekly - 1 sets - 3 reps - 30 hold ASSESSMENT:  CLINICAL IMPRESSION: [name] [name] continues to experience low back pain but locates this more laterally but still on both sides.  We did DN to the piriformis and gluteals bilaterally today as she had some relief from this two session ago.  She is still very hesitant to take any oral medications or have ESI.  We discussed that this would likely interrupt the pain cycle and encouraged her to discuss options with provider.    She would benefit from continued skilled PT for pelvic floor guidance post partum and to address low back pain.     OBJECTIVE IMPAIRMENTS: decreased coordination, decreased endurance, decreased mobility, decreased strength, increased fascial restrictions, impaired flexibility, impaired sensation, improper body mechanics, postural dysfunction, and pain.   ACTIVITY LIMITATIONS: continence and exercise  PARTICIPATION LIMITATIONS: community activity  PERSONAL FACTORS: Fitness, Time since onset of injury/illness/exacerbation, and 1 comorbidity: x3 c-sections  are also affecting patient's functional  outcome.   REHAB POTENTIAL: Good  CLINICAL DECISION MAKING: Evolving/moderate complexity  EVALUATION COMPLEXITY: Moderate   GOALS: Goals reviewed with patient? Yes  SHORT TERM GOALS: Target date: 12/08/22  Pt to be I with HEP.  Baseline: Goal status: INITIAL  2.  Pt to tolerate body weight squats with good technique and pressure management and denies bleeding.  Baseline:  Goal status: INITIAL   LONG TERM GOALS: Target date: 03/12/23  Pt to be I with advanced HEP.  Baseline:  Goal status: INITIAL  2.  Pt will report 50% reduction of pain due to improvements in posture, strength, and muscle length  Baseline: 10/10 at worst Goal status: INITIAL  3.  Pt to demonstrate at least 4/5 pelvic floor strength for improved pelvic stability and decreased strain at pelvic floor/ decrease leakage.  Baseline:  Goal status: INITIAL  4.   Pt to demonstrate improved coordination of pelvic floor and breathing mechanics with 30# squat with appropriate synergistic patterns to decrease pain and leakage at least 75% of the time.    Baseline:  Goal status: INITIAL  5.  Pt to demonstrate at least 5/5 bil hip strength for improved pelvic stability and functional squats without leakage.  Baseline:  Goal status: INITIAL  6. Pt to report no more than one urinary leakage instance in a month for improved QOL and decreased leakage.  Baseline:  Goal status: INITIAL  PLAN:  PT FREQUENCY: 2x/week  PT DURATION: 10 weeks  PLANNED INTERVENTIONS: Therapeutic exercises, Therapeutic activity, Neuromuscular re-education, Patient/Family education, Self Care, Joint mobilization, DME instructions, Aquatic Therapy, Dry Needling, Spinal mobilization, Cryotherapy, Moist heat, scar mobilization, Taping, Vasopneumatic device, Biofeedback, Ionotophoresis 4mg /ml Dexamethasone, Manual therapy, and Re-evaluation  PLAN FOR NEXT SESSION: Assess response to DN #3 to gluteals. Continue to progress core and hip strengthening, coordination of pelvic floor and breathing, flexibility in back and hips,   Tunya Held B. Eron Staat, PT 01/07/23 9:34 AM [facility] [address], [address] [place], [place] [address] Phone # [phone] Fax [phone]

## 2023-01-13 ENCOUNTER — Telehealth: Payer: Self-pay | Admitting: Physical Therapy

## 2023-01-13 ENCOUNTER — Ambulatory Visit: Payer: Medicaid Other | Attending: Obstetrics and Gynecology | Admitting: Physical Therapy

## 2023-01-13 DIAGNOSIS — M6281 Muscle weakness (generalized): Secondary | ICD-10-CM | POA: Insufficient documentation

## 2023-01-13 DIAGNOSIS — R252 Cramp and spasm: Secondary | ICD-10-CM | POA: Insufficient documentation

## 2023-01-13 DIAGNOSIS — M5459 Other low back pain: Secondary | ICD-10-CM | POA: Insufficient documentation

## 2023-01-13 DIAGNOSIS — R293 Abnormal posture: Secondary | ICD-10-CM | POA: Insufficient documentation

## 2023-01-13 NOTE — Telephone Encounter (Signed)
PT called pt about this afternoon's appointment at 1615. Pt very apologetic and reports she completely forgot this appointment, will be at next appointment. Time and day given.   Otelia Sergeant, PT, DPT 11/06/244:32 PM

## 2023-01-20 ENCOUNTER — Ambulatory Visit: Payer: Medicaid Other | Admitting: Physical Therapy

## 2023-01-20 ENCOUNTER — Encounter: Payer: Self-pay | Admitting: Physical Therapy

## 2023-01-20 DIAGNOSIS — M5459 Other low back pain: Secondary | ICD-10-CM | POA: Diagnosis present

## 2023-01-20 DIAGNOSIS — M6281 Muscle weakness (generalized): Secondary | ICD-10-CM

## 2023-01-20 DIAGNOSIS — R293 Abnormal posture: Secondary | ICD-10-CM | POA: Diagnosis present

## 2023-01-20 DIAGNOSIS — R252 Cramp and spasm: Secondary | ICD-10-CM | POA: Diagnosis present

## 2023-01-20 NOTE — Therapy (Signed)
OUTPATIENT PHYSICAL THERAPY FEMALE PELVIC TREATMENT   Patient Name: [name] [name] MRN: [id number] DOB:1990/10/27, 32 y.o., female Today's Date: 01/20/2023  END OF SESSION:  PT End of Session - 01/20/23 1412     Visit Number 18    Date for PT Re-Evaluation 03/12/23    Authorization Type [place] MEDICAID [employer]    PT Start Time 1412   arrival time   PT Stop Time 1443    PT Time Calculation (min) 31 min    Activity Tolerance Patient tolerated treatment well    Behavior During Therapy [facility] for tasks assessed/performed               Past Medical History:  Diagnosis Date   Abnormal Pap smear 03/09/2010   Genital warts    Past Surgical History:  Procedure Laterality Date   CESAREAN SECTION  2009   CESAREAN SECTION  08/12/2011   Procedure: CESAREAN SECTION;  Surgeon: Catalina Antigua, MD;  Location: [facility];  Service: Gynecology;  Laterality: N/A;   CESAREAN SECTION N/A 03/20/2022   Procedure: REPEAT CESAREAN SECTION;  Surgeon: Osborn Coho, MD;  Location: [facility];  Service: Obstetrics;  Laterality: N/A;   DILATION AND CURETTAGE OF UTERUS  2007   TUBAL LIGATION Bilateral 03/20/2022   Procedure: BILATERAL TUBAL LIGATION;  Surgeon: Osborn Coho, MD;  Location: [facility];  Service: Obstetrics;  Laterality: Bilateral;  Ligasure used on both tubes   Patient Active Problem List   Diagnosis Date Noted   Preeclampsia in postpartum period 03/26/2022   PPH (postpartum hemorrhage) 03/23/2022   Status post repeat low transverse cesarean section 03/20/2022   History of miscarriage x 2 03/14/2022   Penicillin allergy 03/14/2022   History of 2 cesarean sections 03/13/2022   Positive GBS test 03/13/2022   LGA (large for gestational age) fetus affecting management of mother 03/13/2022   Family history of hypertrophic cardiomyopathy 12/17/2016   Family history of Wolff-Parkinson-White (WPW) syndrome 12/17/2016    PCP: none per chart  REFERRING PROVIDER: Hands,  Kylie, NP   REFERRING DIAG: M62.9 (ICD-10-CM) - Disorder of muscle, unspecified  THERAPY DIAG:  Muscle weakness (generalized)  Other low back pain  Abnormal posture  Rationale for Evaluation and Treatment: Rehabilitation  ONSET DATE: 7 months ago  SUBJECTIVE:                                                                                                                                                                                           SUBJECTIVE STATEMENT: Had relief day of dry needling but returned. Continues to have 4/10 pain regularly. Is doing more intense work outs,  returning running without increase in pain but no relief.   Fluid intake: Yes: water - a gallon day, a lot of caffeine right now (2 cups day) usually did not due caffeine .    PAIN:  Are you having pain? Yes NPRS scale: 4/10 Pain location:  low back  a lot of tension in mid spine  Pain type: achy Pain description: constant   Aggravating factors: everything Alleviating: nothing   PRECAUTIONS: Other: postpartum  RED FLAGS: None   WEIGHT BEARING RESTRICTIONS: No  FALLS:  Has patient fallen in last 6 months? No  LIVING ENVIRONMENT: Lives with: lives with their family Lives in: House/apartment   OCCUPATION: not currently   PLOF: Independent  PATIENT GOALS: to be stronger especially at pelvic floor, I want to get back to consistent strength training.   PERTINENT HISTORY:  Preeclampsia in postpartum period, 3 c-sections Sexual abuse: No  BOWEL MOVEMENT: Pain with bowel movement: Yes Type of bowel movement:Type (Bristol Stool Scale) 4, Frequency daily, and Strain No Fully empty rectum: No Leakage: No Pads: No Fiber supplement: No  URINATION: Pain with urination: No Fully empty bladder: Yes:   Stream: Strong Urgency: Yes:   Frequency: 2x hourly; not really, gets up for baby  and may go to the bathroom.  Leakage: Urge to void Pads: No  INTERCOURSE: Pain with intercourse:  not  painful, denies dryness, no bleeding  Ability to have vaginal penetration:  Yes:   Climax: not painful Marinoff Scale: 0/3  PREGNANCY: Vaginal deliveries 0  C-section deliveries 3 Currently pregnant No  PROLAPSE: None   OBJECTIVE:   DIAGNOSTIC FINDINGS:  Ultrasound done last week - (-)  PATIENT SURVEYS:   PFIQ-7 29  COGNITION: Overall cognitive status: Within functional limits for tasks assessed     SENSATION: Light touch: Appears intact Proprioception: Appears intact  MUSCLE LENGTH: Bil hamstrings and adductors limited by 25%   POSTURE: rounded shoulders, forward head, and anterior pelvic tilt  PELVIC ALIGNMENT: WFL  LUMBARAROM/PROM:  A/PROM A/PROM  eval  Flexion WFL  Extension WFL  Right lateral flexion Limited by 25%  Left lateral flexion Limited by 25%  Right rotation Limited by 25%  Left rotation Limited by 25%   (Blank rows = not tested)  LOWER EXTREMITY ROM:  WFL  LOWER EXTREMITY MMT:  Bil hips grossly 4/5; knees 5/5  PALPATION:   General  no TTP, but did have some tension at lumbar spine and glutes C-section scar limited in all quadrants and in all directions.                 External Perineal Exam - no pain, [facility]                             Internal Pelvic Floor no pain  Patient confirms identification and approves PT to assess internal pelvic floor and treatment No  PELVIC MMT:   MMT eval 11/24/22   Vaginal  3/5; 3s; 4x  Internal Anal Sphincter    External Anal Sphincter    Puborectalis    Diastasis Recti <1 finger separation above and below with active crunch    (Blank rows = not tested)        TONE: WFL  PROLAPSE: Not seen in hooklying with cough   TODAY'S TREATMENT:  DATE:  01/20/23: Manual with soft tissue massage at lumbar spine bil paraspinals for decreased tension at low back for decreased  pain pt tolerated well. Longitudinal traction with one leg at a time completed bil for improved pain levels at low back. Pt reported immediate relief of pain at Lt leg and then switched to Rt leg, pt tolerated without increased pain reported no change like the Lt. Grade 1-2 mobs at Rt hip with mild improvement of symptoms but not resolved.  Scar tissue manual work completed with scar massage and gentle stretching with fascial release technique at c-section scar with pt demonstrated greatly improved mobility here and tolerating more stretch without increased pain   01/06/23: Nustep  x 5 min level 5 (PT present to discuss status) Standing hamstring stretch 3 x 30 sec Standing quad stretch 3 x 30 sec Seated 3 way ball rolls holding 5 seconds each 5 times each direction Trigger Point Dry-Needling  Treatment instructions: Expect mild to moderate muscle soreness. S/S of pneumothorax if dry needled over a lung field, and to seek immediate medical attention should they occur. Patient verbalized understanding of these instructions and education. Patient Consent Given: Yes Education handout provided: Yes Muscles treated: Glut min, max and piriformis bilaterally Electrical stimulation performed: No Parameters: N/A Treatment response/outcome: Skilled palpation used to identify taut bands and trigger points.  Once identified, dry needling techniques used to treat these areas.  Twitch response ellicited along with palpable elongation of muscle left > right.  Following treatment,  patient reported mild soreness in left glutes.   Ice to lumbar spine at end of session x 10 min in supine hook lying.    12/17/22: Nustep  x 5 min level 5 (PT present to discuss status) Standing hamstring stretch 3 x 30 sec Standing quad stretch 3 x 30 sec Seated piriformis stretch 3 x 30 sec Trigger Point Dry-Needling  Treatment instructions: Expect mild to moderate muscle soreness. S/S of pneumothorax if dry needled over a lung  field, and to seek immediate medical attention should they occur. Patient verbalized understanding of these instructions and education. Patient Consent Given: Yes Education handout provided: Yes Muscles treated: Lumbar multifidi,  Electrical stimulation performed: No Parameters: N/A Treatment response/outcome: Skilled palpation used to identify taut bands and trigger points.  Once identified, dry needling techniques used to treat these areas.  Twitch response ellicited along with palpable elongation of muscle.  Following treatment,  patient reported mild soreness in upper traps and low back.     No emotional/communication barriers or cognitive limitation. Patient is motivated to learn. Patient understands and agrees with treatment goals and plan. PT explains patient will be examined in standing, sitting, and lying down to see how their muscles and joints work. When they are ready, they will be asked to remove their underwear so PT can examine their perineum. The patient is also given the option of providing their own chaperone as one is not provided in our facility. The patient also has the right and is explained the right to defer or refuse any part of the evaluation or treatment including the internal exam. With the patient's consent, PT will use one gloved finger to gently assess the muscles of the pelvic floor, seeing how well it contracts and relaxes and if there is muscle symmetry. After, the patient will get dressed and PT and patient will discuss exam findings and plan of care. PT and patient discuss plan of care, schedule, attendance policy and HEP activities.  PATIENT EDUCATION:  Education details: [phone] Person educated: Patient Education method: Explanation, Demonstration, Tactile cues, Verbal cues, and Handouts Education comprehension: verbalized understanding and returned demonstration  HOME EXERCISE PROGRAM: Access Code: [email or web address] URL:  [email or web address] Date: 11/26/2022 Prepared by: Mikey Kirschner  Exercises - Wall Angels  - 1 x daily - 7 x weekly - 1 sets - 3 reps - 30s holds - Cat Cow  - 1 x daily - 7 x weekly - 1 sets - 10 reps - Supine Diaphragmatic Breathing  - 1 x daily - 7 x weekly - 1 sets - 10 reps - Supine Transversus Abdominis Bracing - Hands on Stomach  - 1 x daily - 7 x weekly - 2 sets - 10 reps - Sidelying Thoracic Rotation with Open Book  - 1 x daily - 7 x weekly - 1 sets - 3 reps - 30s holds - Supine Hip Internal and External Rotation  - 1 x daily - 7 x weekly - 1 sets - 10 reps - Supine 90/90 Alternating Heel Touches with Posterior Pelvic Tilt  - 1 x daily - 7 x weekly - 2 sets - 10 reps - Supine Dead Bug with Leg Extension  - 1 x daily - 7 x weekly - 2 sets - 10 reps - Standing Hamstring Stretch on Chair  - 1 x daily - 7 x weekly - 1 sets - 3 reps - 30 sec hold - Standing Quad Stretch with Table and Chair Support  - 1 x daily - 7 x weekly - 1 sets - 3 reps - 30 hold ASSESSMENT:  CLINICAL IMPRESSION: Olene continues to experience low back pain but locates this more laterally but still on both sides.  PT encouraged pt to reach out to MD to let them know she is still having pain to address additional medical management. Pt verbalized understanding. Does want to attempt 1-2 more sessions of therapy but focus more low back pain as pt no longer having any pelvic floor specific symptoms. PT did complete scar tissue mobility at c-section scar and this is improving in mobility. Pt tolerated manual for low back well but only good relief on Lt side noted. She would benefit from continued skilled PT for pelvic floor guidance post partum and to address low back pain.     OBJECTIVE IMPAIRMENTS: decreased coordination, decreased endurance, decreased mobility, decreased strength, increased fascial restrictions, impaired flexibility, impaired sensation, improper body mechanics, postural dysfunction, and  pain.   ACTIVITY LIMITATIONS: continence and exercise  PARTICIPATION LIMITATIONS: community activity  PERSONAL FACTORS: Fitness, Time since onset of injury/illness/exacerbation, and 1 comorbidity: x3 c-sections  are also affecting patient's functional outcome.   REHAB POTENTIAL: Good  CLINICAL DECISION MAKING: Evolving/moderate complexity  EVALUATION COMPLEXITY: Moderate   GOALS: Goals reviewed with patient? Yes  SHORT TERM GOALS: Target date: 12/08/22  Pt to be I with HEP.  Baseline: Goal status: MET  2.  Pt to tolerate body weight squats with good technique and pressure management and denies bleeding.  Baseline:  Goal status: MET   LONG TERM GOALS: Target date: 03/12/23  Pt to be I with advanced HEP.  Baseline:  Goal status: MET  2.  Pt will report 50% reduction of pain due to improvements in posture, strength, and muscle length  Baseline: 10/10 at worst Goal status: MET (4/10)  3.  Pt to demonstrate at least 4/5 pelvic floor strength for improved pelvic stability and decreased strain at pelvic floor/ decrease leakage.  Baseline:  Goal status:  on going  4.  Pt to demonstrate improved coordination of pelvic floor and breathing mechanics with 30# squat with appropriate synergistic patterns to decrease pain and leakage at least 75% of the time.    Baseline:  Goal status: on going  5.  Pt to demonstrate at least 5/5 bil hip strength for improved pelvic stability and functional squats without leakage.  Baseline:  Goal status: on going  6. Pt to report no more than one urinary leakage instance in a month for improved QOL and decreased leakage.  Baseline:  Goal status: MET  PLAN:  PT FREQUENCY: 2x/week  PT DURATION: 10 weeks  PLANNED INTERVENTIONS: Therapeutic exercises, Therapeutic activity, Neuromuscular re-education, Patient/Family education, Self Care, Joint mobilization, DME instructions, Aquatic Therapy, Dry Needling, Spinal mobilization, Cryotherapy, Moist  heat, scar mobilization, Taping, Vasopneumatic device, Biofeedback, Ionotophoresis 4mg /ml Dexamethasone, Manual therapy, and Re-evaluation  PLAN FOR NEXT SESSION: Assess response to DN #3 to gluteals. Continue to progress core and hip strengthening, coordination of pelvic floor and breathing, flexibility in back and hips,   Jennifer B. Fields, PT 01/20/23 3:34 PM [facility] [address] [place], [place] [address] Phone # [phone] Fax [phone]

## 2023-01-23 MED ORDER — CHLORHEXIDINE GLUCONATE 0.12 % MT SOLN
OROMUCOSAL | Status: AC
  Filled 2023-01-23: qty 15

## 2023-01-25 ENCOUNTER — Encounter (HOSPITAL_COMMUNITY): Payer: Self-pay | Admitting: Obstetrics & Gynecology

## 2023-01-25 ENCOUNTER — Other Ambulatory Visit: Payer: Self-pay

## 2023-01-25 NOTE — Progress Notes (Signed)
Ms. [name] [name] denies chest pain or shortness of breath.  Patient denies having any s/s of Covid in her household, also denies any known exposure to Covid. Ms. [name] [name] denies  any s/s of upper or lower respiratory infection in the past 8 weeks.  Ms. [name] [name] said that the procedure is supposed to be after she starts her period, she has not started yet, "it should be tomorrow," patient said.  I told patient that I do not know anything about it. Ms [name] [name] said that she left a voice message at the office, no one has returned the call.  Patient said she will call the office again before it closes.

## 2023-01-26 ENCOUNTER — Ambulatory Visit (HOSPITAL_COMMUNITY)
Admission: RE | Admit: 2023-01-26 | Payer: Medicaid Other | Source: Home / Self Care | Admitting: Obstetrics & Gynecology

## 2023-01-26 HISTORY — DX: Headache, unspecified: R51.9

## 2023-01-26 SURGERY — DILATATION & CURETTAGE/HYSTEROSCOPY WITH MYOSURE
Anesthesia: Monitor Anesthesia Care

## 2023-01-27 ENCOUNTER — Encounter: Payer: Self-pay | Admitting: Physical Therapy

## 2023-02-01 ENCOUNTER — Other Ambulatory Visit: Payer: Self-pay | Admitting: Obstetrics & Gynecology

## 2023-02-01 ENCOUNTER — Encounter (HOSPITAL_COMMUNITY): Payer: Self-pay | Admitting: Obstetrics & Gynecology

## 2023-02-01 NOTE — Progress Notes (Signed)
SDW call  Patient was given pre-op instructions over the phone. Patient verbalized understanding of instructions provided.     PCP - Denies Cardiologist -  Pulmonary:    PPM/ICD - denies Device Orders - na Rep Notified - na   Chest x-ray - na EKG -  03/26/2022 Stress Test - ECHO - 12/18/2016 Cardiac Cath -   Sleep Study/sleep apnea/CPAP: denies  Non-diabetic  Blood Thinner Instructions: denies Aspirin Instructions:denies   ERAS Protcol - Clears until 1100   COVID TEST- na    Anesthesia review: No   Patient denies shortness of breath, fever, cough and chest pain over the phone call  Your procedure is scheduled on Tuesday February 02, 2023   Report to [facility] Main Entrance "A" at  1130  A.M., then check in with the Admitting office.  Call this number if you have problems the morning of surgery:  [phone]   If you have any questions prior to your surgery date call [phone]: Open Monday-Friday 8am-4pm If you experience any cold or flu symptoms such as cough, fever, chills, shortness of breath, etc. between now and your scheduled surgery, please notify us at the above number    Remember:  Do not eat after midnight the night before your surgery  You may drink clear liquids until  1100 the morning of your surgery.   Clear liquids allowed are: Water, Non-Citrus Juices (without pulp), Carbonated Beverages, Clear Tea, Black Coffee ONLY (NO MILK, CREAM OR POWDERED CREAMER of any kind), and Gatorade   Take these medicines the morning of surgery with A SIP OF WATER: None  As of today, STOP taking any Aspirin (unless otherwise instructed by your surgeon) Aleve, Naproxen, Ibuprofen, Motrin, Advil, Goody's, BC's, all herbal medications, fish oil, and all vitamins.

## 2023-02-02 ENCOUNTER — Ambulatory Visit: Payer: Medicaid Other | Admitting: Physical Therapy

## 2023-02-02 ENCOUNTER — Ambulatory Visit (HOSPITAL_COMMUNITY)
Admission: RE | Admit: 2023-02-02 | Payer: Medicaid Other | Source: Home / Self Care | Admitting: Obstetrics & Gynecology

## 2023-02-02 ENCOUNTER — Encounter (HOSPITAL_COMMUNITY): Payer: Self-pay | Admitting: Certified Registered Nurse Anesthetist

## 2023-02-02 ENCOUNTER — Encounter (HOSPITAL_COMMUNITY): Payer: Self-pay | Admitting: Obstetrics & Gynecology

## 2023-02-02 DIAGNOSIS — R293 Abnormal posture: Secondary | ICD-10-CM

## 2023-02-02 DIAGNOSIS — M6281 Muscle weakness (generalized): Secondary | ICD-10-CM

## 2023-02-02 DIAGNOSIS — M5459 Other low back pain: Secondary | ICD-10-CM

## 2023-02-02 DIAGNOSIS — R252 Cramp and spasm: Secondary | ICD-10-CM

## 2023-02-02 SURGERY — DILATATION & CURETTAGE/HYSTEROSCOPY WITH MYOSURE
Anesthesia: Monitor Anesthesia Care

## 2023-02-02 NOTE — Therapy (Addendum)
OUTPATIENT PHYSICAL THERAPY FEMALE PELVIC TREATMENT   Patient Name: [name] [name] MRN: [id number] DOB:1990-12-18, 32 y.o., female Today's Date: 02/02/2023  END OF SESSION:  PT End of Session - 02/02/23 0850     Visit Number 19    Date for PT Re-Evaluation 03/12/23    Authorization Type [place] MEDICAID [employer]    PT Start Time [phone]    PT Stop Time 0930    PT Time Calculation (min) 39 min    Activity Tolerance Patient tolerated treatment well    Behavior During Therapy [facility] for tasks assessed/performed               Past Medical History:  Diagnosis Date   Abnormal Pap smear 03/09/2010   Genital warts    Headache    Past Surgical History:  Procedure Laterality Date   CESAREAN SECTION  2009   CESAREAN SECTION  08/12/2011   Procedure: CESAREAN SECTION;  Surgeon: Catalina Antigua, MD;  Location: [facility];  Service: Gynecology;  Laterality: N/A;   CESAREAN SECTION N/A 03/20/2022   Procedure: REPEAT CESAREAN SECTION;  Surgeon: Osborn Coho, MD;  Location: [facility];  Service: Obstetrics;  Laterality: N/A;   DILATION AND CURETTAGE OF UTERUS  2007   TUBAL LIGATION Bilateral 03/20/2022   Procedure: BILATERAL TUBAL LIGATION;  Surgeon: Osborn Coho, MD;  Location: [facility];  Service: Obstetrics;  Laterality: Bilateral;  Ligasure used on both tubes   Patient Active Problem List   Diagnosis Date Noted   Preeclampsia in postpartum period 03/26/2022   PPH (postpartum hemorrhage) 03/23/2022   Status post repeat low transverse cesarean section 03/20/2022   History of miscarriage x 2 03/14/2022   Penicillin allergy 03/14/2022   History of 2 cesarean sections 03/13/2022   Positive GBS test 03/13/2022   LGA (large for gestational age) fetus affecting management of mother 03/13/2022   Family history of hypertrophic cardiomyopathy 12/17/2016   Family history of Wolff-Parkinson-White (WPW) syndrome 12/17/2016    PCP: none per chart  REFERRING PROVIDER: Hands,  Kylie, NP   REFERRING DIAG: M62.9 (ICD-10-CM) - Disorder of muscle, unspecified  THERAPY DIAG:  Muscle weakness (generalized)  Other low back pain  Abnormal posture  Cramp and spasm  Rationale for Evaluation and Treatment: Rehabilitation  ONSET DATE: 7 months ago  SUBJECTIVE:                                                                                                                                                                                           SUBJECTIVE STATEMENT: Had a rough week with pain with packing and trying to lift a little more  with usual with prepping to move. Plans to go to [facility] for more medical treatment for remaining pain.   Fluid intake: Yes: water - a gallon day, a lot of caffeine right now (2 cups day) usually did not due caffeine .    PAIN:  Are you having pain? Yes NPRS scale: 4/10 Pain location:  low back  a lot of tension in mid spine  Pain type: achy Pain description: constant   Aggravating factors: everything Alleviating: nothing   PRECAUTIONS: Other: postpartum  RED FLAGS: None   WEIGHT BEARING RESTRICTIONS: No  FALLS:  Has patient fallen in last 6 months? No  LIVING ENVIRONMENT: Lives with: lives with their family Lives in: House/apartment   OCCUPATION: not currently   PLOF: Independent  PATIENT GOALS: to be stronger especially at pelvic floor, I want to get back to consistent strength training.   PERTINENT HISTORY:  Preeclampsia in postpartum period, 3 c-sections Sexual abuse: No  BOWEL MOVEMENT: Pain with bowel movement: Yes Type of bowel movement:Type (Bristol Stool Scale) 4, Frequency daily, and Strain No Fully empty rectum: No Leakage: No Pads: No Fiber supplement: No  URINATION: Pain with urination: No Fully empty bladder: Yes:   Stream: Strong Urgency: Yes:   Frequency: 2x hourly; not really, gets up for baby  and may go to the bathroom.  Leakage: Urge to void Pads: No  INTERCOURSE: Pain  with intercourse:  not painful, denies dryness, no bleeding  Ability to have vaginal penetration:  Yes:   Climax: not painful Marinoff Scale: 0/3  PREGNANCY: Vaginal deliveries 0  C-section deliveries 3 Currently pregnant No  PROLAPSE: None   OBJECTIVE:   DIAGNOSTIC FINDINGS:  Ultrasound done last week - (-)  PATIENT SURVEYS:   PFIQ-7 29  COGNITION: Overall cognitive status: Within functional limits for tasks assessed     SENSATION: Light touch: Appears intact Proprioception: Appears intact  MUSCLE LENGTH: Bil hamstrings and adductors limited by 25%   POSTURE: rounded shoulders, forward head, and anterior pelvic tilt  PELVIC ALIGNMENT: WFL  LUMBARAROM/PROM:  A/PROM A/PROM  eval  Flexion WFL  Extension WFL  Right lateral flexion Limited by 25%  Left lateral flexion Limited by 25%  Right rotation Limited by 25%  Left rotation Limited by 25%   (Blank rows = not tested)  LOWER EXTREMITY ROM:  WFL  LOWER EXTREMITY MMT:  Bil hips grossly 4/5; knees 5/5  PALPATION:   General  no TTP, but did have some tension at lumbar spine and glutes C-section scar limited in all quadrants and in all directions.                 External Perineal Exam - no pain, [facility]                             Internal Pelvic Floor no pain  Patient confirms identification and approves PT to assess internal pelvic floor and treatment No  PELVIC MMT:   MMT eval 11/24/22   Vaginal  3/5; 3s; 4x  Internal Anal Sphincter    External Anal Sphincter    Puborectalis    Diastasis Recti <1 finger separation above and below with active crunch    (Blank rows = not tested)        TONE: WFL  PROLAPSE: Not seen in hooklying with cough   TODAY'S TREATMENT:  DATE:   02/02/2023:  Bike 5 mins L4 20# deadlifts 2x10 Golfers carry 10# x10  Marjo Bicker pose 3x30s Marjo Bicker  pose with trunk rotation 3x30s each Trunk extension over red yoga ball 2x1 min Lateral trunk stretch over red yoga all 2x1 min each Thoracic extension over foam roller 1 min X10 cactus stretching in sitting  01/20/23: Manual with soft tissue massage at lumbar spine bil paraspinals for decreased tension at low back for decreased pain pt tolerated well. Longitudinal traction with one leg at a time completed bil for improved pain levels at low back. Pt reported immediate relief of pain at Lt leg and then switched to Rt leg, pt tolerated without increased pain reported no change like the Lt. Grade 1-2 mobs at Rt hip with mild improvement of symptoms but not resolved.  Scar tissue manual work completed with scar massage and gentle stretching with fascial release technique at c-section scar with pt demonstrated greatly improved mobility here and tolerating more stretch without increased pain   01/06/23: Nustep  x 5 min level 5 (PT present to discuss status) Standing hamstring stretch 3 x 30 sec Standing quad stretch 3 x 30 sec Seated 3 way ball rolls holding 5 seconds each 5 times each direction Trigger Point Dry-Needling  Treatment instructions: Expect mild to moderate muscle soreness. S/S of pneumothorax if dry needled over a lung field, and to seek immediate medical attention should they occur. Patient verbalized understanding of these instructions and education. Patient Consent Given: Yes Education handout provided: Yes Muscles treated: Glut min, max and piriformis bilaterally Electrical stimulation performed: No Parameters: N/A Treatment response/outcome: Skilled palpation used to identify taut bands and trigger points.  Once identified, dry needling techniques used to treat these areas.  Twitch response ellicited along with palpable elongation of muscle left > right.  Following treatment,  patient reported mild soreness in left glutes.   Ice to lumbar spine at end of session x 10 min in supine  hook lying.      No emotional/communication barriers or cognitive limitation. Patient is motivated to learn. Patient understands and agrees with treatment goals and plan. PT explains patient will be examined in standing, sitting, and lying down to see how their muscles and joints work. When they are ready, they will be asked to remove their underwear so PT can examine their perineum. The patient is also given the option of providing their own chaperone as one is not provided in our facility. The patient also has the right and is explained the right to defer or refuse any part of the evaluation or treatment including the internal exam. With the patient's consent, PT will use one gloved finger to gently assess the muscles of the pelvic floor, seeing how well it contracts and relaxes and if there is muscle symmetry. After, the patient will get dressed and PT and patient will discuss exam findings and plan of care. PT and patient discuss plan of care, schedule, attendance policy and HEP activities.                  PATIENT EDUCATION:  Education details: [phone] Person educated: Patient Education method: Explanation, Demonstration, Tactile cues, Verbal cues, and Handouts Education comprehension: verbalized understanding and returned demonstration  HOME EXERCISE PROGRAM: Access Code: [email or web address] URL: [email or web address] Date: 11/26/2022 Prepared by: Mikey Kirschner  Exercises - Wall Angels  - 1 x daily - 7 x weekly - 1 sets - 3 reps - 30s holds - Cat Cow  - 1 x  daily - 7 x weekly - 1 sets - 10 reps - Supine Diaphragmatic Breathing  - 1 x daily - 7 x weekly - 1 sets - 10 reps - Supine Transversus Abdominis Bracing - Hands on Stomach  - 1 x daily - 7 x weekly - 2 sets - 10 reps - Sidelying Thoracic Rotation with Open Book  - 1 x daily - 7 x weekly - 1 sets - 3 reps - 30s holds - Supine Hip Internal and External Rotation  - 1 x daily - 7 x weekly - 1 sets - 10 reps - Supine 90/90  Alternating Heel Touches with Posterior Pelvic Tilt  - 1 x daily - 7 x weekly - 2 sets - 10 reps - Supine Dead Bug with Leg Extension  - 1 x daily - 7 x weekly - 2 sets - 10 reps - Standing Hamstring Stretch on Chair  - 1 x daily - 7 x weekly - 1 sets - 3 reps - 30 sec hold - Standing Quad Stretch with Table and Chair Support  - 1 x daily - 7 x weekly - 1 sets - 3 reps - 30 hold ASSESSMENT:  CLINICAL IMPRESSION: Jacob continues to experience low back pain but locates this more laterally but still on both sides.  PT encouraged pt to reach out to MD to let them know she is still having pain to address additional medical management and pt has done this awaiting scheduling for this follow up. Pt feels confident in continuing scar massage at home, understands stretching for low back pain at home. Would like today to be last day and PT in agreement until follow up with medical provider and has plan for remaining back pain. Today will be pt's discharge from PT.     OBJECTIVE IMPAIRMENTS: decreased coordination, decreased endurance, decreased mobility, decreased strength, increased fascial restrictions, impaired flexibility, impaired sensation, improper body mechanics, postural dysfunction, and pain.   ACTIVITY LIMITATIONS: continence and exercise  PARTICIPATION LIMITATIONS: community activity  PERSONAL FACTORS: Fitness, Time since onset of injury/illness/exacerbation, and 1 comorbidity: x3 c-sections  are also affecting patient's functional outcome.   REHAB POTENTIAL: Good  CLINICAL DECISION MAKING: Evolving/moderate complexity  EVALUATION COMPLEXITY: Moderate   GOALS: Goals reviewed with patient? Yes  SHORT TERM GOALS: Target date: 12/08/22  Pt to be I with HEP.  Baseline: Goal status: MET  2.  Pt to tolerate body weight squats with good technique and pressure management and denies bleeding.  Baseline:  Goal status: MET   LONG TERM GOALS: Target date: 03/12/23  Pt to be I with  advanced HEP.  Baseline:  Goal status: MET  2.  Pt will report 50% reduction of pain due to improvements in posture, strength, and muscle length  Baseline: 10/10 at worst Goal status: MET (4/10)  3.  Pt to demonstrate at least 4/5 pelvic floor strength for improved pelvic stability and decreased strain at pelvic floor/ decrease leakage.  Baseline:  Goal status: deferred as pt not having pelvic floor symptoms any longer  4.  Pt to demonstrate improved coordination of pelvic floor and breathing mechanics with 30# squat with appropriate synergistic patterns to decrease pain and leakage at least 75% of the time.    Baseline:  Goal status: MET  5.  Pt to demonstrate at least 5/5 bil hip strength for improved pelvic stability and functional squats without leakage.  Baseline:  Goal status: MET  6. Pt to report no more than one urinary leakage instance  in a month for improved QOL and decreased leakage.  Baseline:  Goal status: MET  PLAN:  PT FREQUENCY: 2x/week  PT DURATION: 10 weeks  PLANNED INTERVENTIONS: Therapeutic exercises, Therapeutic activity, Neuromuscular re-education, Patient/Family education, Self Care, Joint mobilization, DME instructions, Aquatic Therapy, Dry Needling, Spinal mobilization, Cryotherapy, Moist heat, scar mobilization, Taping, Vasopneumatic device, Biofeedback, Ionotophoresis 4mg /ml Dexamethasone, Manual therapy, and Re-evaluation  PLAN FOR NEXT SESSION:   PHYSICAL THERAPY DISCHARGE SUMMARY  Visits from Start of Care: 19  Current functional level related to goals / functional outcomes: 2/2 STG met, 5/6 LTG met   Remaining deficits: Continues to have low back pain   Education / Equipment: HEP   Patient agrees to discharge. Patient goals were partially met. Patient is being discharged due to lack of progress.   Otelia Sergeant, PT, DPT 02/01/2409:15 AM  PHYSICAL THERAPY DISCHARGE SUMMARY  Visits from Start of Care: 19  Current functional level  related to goals / functional outcomes: Pt to return to medical provider for additional pain management   Remaining deficits: Back pain   Education / Equipment: HEP   Patient agrees to discharge. Patient goals were partially met. Patient is being discharged due to  pt continues to have back pain, has improved but not fully resolved.  Otelia Sergeant, PT, DPT 04/11/2509:36 AM  [facility] [address] [place], [place] [address] Phone # [phone] Fax [phone]

## 2023-02-16 ENCOUNTER — Encounter: Payer: Medicaid Other | Admitting: Physical Therapy

## 2023-03-08 ENCOUNTER — Encounter: Payer: Medicaid Other | Admitting: Physical Therapy
